# Patient Record
Sex: Male | Born: 1935 | Race: White | Hispanic: No | Smoking: Former smoker
Health system: Southern US, Community
[De-identification: ages and names within clinical notes are randomized; demographics above are authoritative.]

## PROBLEM LIST (undated history)

## (undated) DIAGNOSIS — R531 Weakness: Secondary | ICD-10-CM

## (undated) DIAGNOSIS — I8390 Asymptomatic varicose veins of unspecified lower extremity: Secondary | ICD-10-CM

## (undated) DIAGNOSIS — Z860100 Personal history of colon polyps, unspecified: Secondary | ICD-10-CM

## (undated) DIAGNOSIS — M542 Cervicalgia: Secondary | ICD-10-CM

## (undated) DIAGNOSIS — I10 Essential (primary) hypertension: Secondary | ICD-10-CM

## (undated) DIAGNOSIS — M1990 Unspecified osteoarthritis, unspecified site: Secondary | ICD-10-CM

## (undated) DIAGNOSIS — R350 Frequency of micturition: Secondary | ICD-10-CM

## (undated) DIAGNOSIS — F329 Major depressive disorder, single episode, unspecified: Secondary | ICD-10-CM

## (undated) DIAGNOSIS — C801 Malignant (primary) neoplasm, unspecified: Secondary | ICD-10-CM

## (undated) DIAGNOSIS — F32A Depression, unspecified: Secondary | ICD-10-CM

## (undated) DIAGNOSIS — H9190 Unspecified hearing loss, unspecified ear: Secondary | ICD-10-CM

## (undated) DIAGNOSIS — E785 Hyperlipidemia, unspecified: Secondary | ICD-10-CM

## (undated) DIAGNOSIS — Z8719 Personal history of other diseases of the digestive system: Secondary | ICD-10-CM

## (undated) DIAGNOSIS — Z87898 Personal history of other specified conditions: Secondary | ICD-10-CM

## (undated) DIAGNOSIS — Z87442 Personal history of urinary calculi: Secondary | ICD-10-CM

## (undated) DIAGNOSIS — G2581 Restless legs syndrome: Secondary | ICD-10-CM

## (undated) DIAGNOSIS — F419 Anxiety disorder, unspecified: Secondary | ICD-10-CM

## (undated) DIAGNOSIS — G8929 Other chronic pain: Secondary | ICD-10-CM

## (undated) HISTORY — DX: Essential (primary) hypertension: I10

## (undated) HISTORY — PX: HERNIA REPAIR: SHX51

## (undated) HISTORY — DX: Malignant (primary) neoplasm, unspecified: C80.1

## (undated) HISTORY — PX: OTHER SURGICAL HISTORY: SHX169

## (undated) HISTORY — PX: COLONOSCOPY: SHX174

## (undated) HISTORY — DX: Asymptomatic varicose veins of unspecified lower extremity: I83.90

## (undated) HISTORY — PX: INNER EAR SURGERY: SHX679

## (undated) HISTORY — PX: JOINT REPLACEMENT: SHX530

## (undated) HISTORY — PX: PROSTATE SURGERY: SHX751

## (undated) HISTORY — DX: Hyperlipidemia, unspecified: E78.5

## (undated) HISTORY — PX: EYE SURGERY: SHX253

## (undated) HISTORY — PX: BACK SURGERY: SHX140

---

## 1994-01-14 DIAGNOSIS — C801 Malignant (primary) neoplasm, unspecified: Secondary | ICD-10-CM

## 1994-01-14 HISTORY — DX: Malignant (primary) neoplasm, unspecified: C80.1

## 1998-10-11 ENCOUNTER — Encounter: Payer: Self-pay | Admitting: General Surgery

## 1998-10-13 ENCOUNTER — Ambulatory Visit (HOSPITAL_COMMUNITY): Admission: RE | Admit: 1998-10-13 | Discharge: 1998-10-14 | Payer: Self-pay | Admitting: General Surgery

## 2005-09-04 ENCOUNTER — Encounter: Admission: RE | Admit: 2005-09-04 | Discharge: 2005-09-04 | Payer: Self-pay | Admitting: Orthopaedic Surgery

## 2005-10-23 ENCOUNTER — Inpatient Hospital Stay (HOSPITAL_COMMUNITY): Admission: RE | Admit: 2005-10-23 | Discharge: 2005-10-27 | Payer: Self-pay | Admitting: Orthopaedic Surgery

## 2007-10-27 ENCOUNTER — Ambulatory Visit (HOSPITAL_COMMUNITY): Admission: RE | Admit: 2007-10-27 | Discharge: 2007-10-28 | Payer: Self-pay | Admitting: Urology

## 2009-10-30 ENCOUNTER — Inpatient Hospital Stay (HOSPITAL_COMMUNITY)
Admission: RE | Admit: 2009-10-30 | Discharge: 2009-11-02 | Payer: Self-pay | Source: Home / Self Care | Admitting: Orthopedic Surgery

## 2010-03-28 LAB — CBC
HCT: 26.8 % — ABNORMAL LOW (ref 39.0–52.0)
HCT: 28 % — ABNORMAL LOW (ref 39.0–52.0)
HCT: 30.5 % — ABNORMAL LOW (ref 39.0–52.0)
Hemoglobin: 10 g/dL — ABNORMAL LOW (ref 13.0–17.0)
Hemoglobin: 10.7 g/dL — ABNORMAL LOW (ref 13.0–17.0)
Hemoglobin: 9.5 g/dL — ABNORMAL LOW (ref 13.0–17.0)
MCHC: 35.1 g/dL (ref 30.0–36.0)
MCV: 95.8 fL (ref 78.0–100.0)
MCV: 96.1 fL (ref 78.0–100.0)
RBC: 2.79 MIL/uL — ABNORMAL LOW (ref 4.22–5.81)
RBC: 2.92 MIL/uL — ABNORMAL LOW (ref 4.22–5.81)
RBC: 3.18 MIL/uL — ABNORMAL LOW (ref 4.22–5.81)
WBC: 8.6 10*3/uL (ref 4.0–10.5)
WBC: 9 10*3/uL (ref 4.0–10.5)

## 2010-03-28 LAB — BASIC METABOLIC PANEL
BUN: 11 mg/dL (ref 6–23)
CO2: 30 mEq/L (ref 19–32)
Chloride: 102 mEq/L (ref 96–112)
GFR calc Af Amer: >60 mL/min (ref >60)
GFR calc non Af Amer: >60 mL/min (ref >60)
GFR calc non Af Amer: >60 mL/min (ref >60)
Glucose, Bld: 149 mg/dL — ABNORMAL HIGH (ref 70–99)
Potassium: 4 mEq/L (ref 3.5–5.1)
Potassium: 4.3 mEq/L (ref 3.5–5.1)
Sodium: 138 mEq/L (ref 135–145)
Sodium: 139 mEq/L (ref 135–145)

## 2010-03-28 LAB — PROTIME-INR
INR: 1.13 (ref 0.00–1.49)
INR: 1.51 — ABNORMAL HIGH (ref 0.00–1.49)
INR: 2.17 — ABNORMAL HIGH (ref 0.00–1.49)

## 2010-03-28 LAB — TYPE AND SCREEN: Antibody Screen: NEGATIVE

## 2010-03-29 LAB — COMPREHENSIVE METABOLIC PANEL
ALT: 24 U/L (ref 0–53)
Albumin: 4.4 g/dL (ref 3.5–5.2)
Alkaline Phosphatase: 43 U/L (ref 39–117)
BUN: 17 mg/dL (ref 6–23)
Chloride: 104 mEq/L (ref 96–112)
Glucose, Bld: 114 mg/dL — ABNORMAL HIGH (ref 70–99)
Potassium: 4.3 mEq/L (ref 3.5–5.1)
Sodium: 139 mEq/L (ref 135–145)
Total Bilirubin: 1.7 mg/dL — ABNORMAL HIGH (ref 0.3–1.2)
Total Protein: 7.8 g/dL (ref 6.0–8.3)

## 2010-03-29 LAB — URINALYSIS, ROUTINE W REFLEX MICROSCOPIC
Glucose, UA: NEGATIVE mg/dL
Ketones, ur: NEGATIVE mg/dL
Nitrite: NEGATIVE
Specific Gravity, Urine: 1.012 (ref 1.005–1.030)
pH: 7.5 (ref 5.0–8.0)

## 2010-03-29 LAB — CBC
HCT: 44.4 % (ref 39.0–52.0)
MCV: 96.6 fL (ref 78.0–100.0)
RBC: 4.6 MIL/uL (ref 4.22–5.81)
WBC: 8.9 10*3/uL (ref 4.0–10.5)

## 2010-03-29 LAB — PROTIME-INR
INR: 0.94 (ref 0.00–1.49)
Prothrombin Time: 12.8 seconds (ref 11.6–15.2)

## 2010-03-29 LAB — SURGICAL PCR SCREEN: MRSA, PCR: NEGATIVE

## 2010-05-29 NOTE — Op Note (Signed)
NAME:  [name], [name]              ACCOUNT NO.:  [id number]   MEDICAL RECORD NO.:  [id number]          PATIENT TYPE:  AMB   LOCATION:  DAY                          FACILITY:  [facility]   PHYSICIAN:  Courtney Paris, M.D.DATE OF BIRTH:  1933-08-08   DATE OF PROCEDURE:  10/27/2007  DATE OF DISCHARGE:                               OPERATIVE REPORT   PREOPERATIVE DIAGNOSIS:  Malfunctioning inflatable penile prosthesis.   POSTOPERATIVE DIAGNOSIS:  Malfunctioning inflatable penile prosthesis.   PROCEDURE:  Replace Ultrex AMS inflatable penile prosthesis with  AMS  700 CX pre-connect.   ANESTHESIA:  General.   SURGEON:  Courtney Paris, M.D.   ASSISTANTLorin Picket A. MacDiarmid, MD.   BRIEF HISTORY:  This 77 year old patient had a radical prostatectomy for  prostate cancer in December, 1996 that has not recurred.  He had a  inflatable penile prosthesis inserted in 1998 that worked well up until  August of this year.  It just stopped functioning, probably with a leak.  He enters to have this replaced at this time.  Other operations include  two rods were placed in his back in October, 2007.  They were originally  placed in 1973.   The patient was placed on the operating table in the dorsal lithotomy  position.  After satisfactory induction of general anesthesia he was  prepped and draped with Betadine in the usual sterile fashion.  He had 5-  minute timed scrub.  Time-out was then performed and the patient and the  procedure were then reconfirmed.  He had preop antibiotics given  appropriately.  His original prosthesis had been placed infrapubically.  So it decided to go ahead and do it again this way since all of the  tubing was up in that location.  An incision was made with the knife  blade and then carried down with the Bovie cutting current down to  expose the tubing.  The tubing was traced down to each corpora which was  then opened and the distal end of the prosthesis came  easily, but the  proximal ends had to be dissected out.  The covering of the prosthesis  tended to fray somewhat, but we did get the entire prosthesis with the  rear tips out on both sides.  There was very little bleeding within the  pseudocapsule.  Next, the pump in the left upper scrotum was dissected  free of the pseudocapsule and the tubing going to the reservoir in his  left lower quadrant which had been placed with a separate incision was  traced back as far as it would go and then cut.  It was decided to go  ahead leave that in situ and then place the new reservoir on the  contralateral right side rather then try to dissect out the old  reservoir.  The incision was lengthened slightly and with traction the  cord structures were identified and then the external oblique fascia was  identified and a small incision made in this and the muscle was  dissected through and the preperitoneal space was then opened.  This was  developed  manually with fingers.  The reservoir which would hold 100 mL  was then placed into this space and then inflated.  It was slightly  hydrodistended with about 110 mL of fluid and then with 95 mL of fluid  left in it.  There was absolutely no back pressure.  This was left  clamped off and the space closed carefully with 2-0 Vicryl suture.  Next, the corpora was then sized.  It was 10 cm proximally and 12 cm  distally on either side.  Using the pre-connected prosthesis originally  for the penile scrotal area seemed to fit well with 15 cm of prosthesis  with 7 cm of rear tip extenders.  These were placed on so that they  stacked nicely.  The sutures were then placed with 2-0 Vicryl through  the corpora cavernosa on either side prior to placing of the of  prosthesis which was then filled on the back table in a sterile fashion.  When this was done on both sides the prosthesis was first placed on the  right side by placing the Hardy needle through the end of the  prosthesis  and poking this out through the glans and pulling up the prosthesis  distally and placed proximally without any tension.  When this was done  on the right side the sutures that had been preplaced with Vicryl were  then tied over the prosthesis leaving a nice closure on the right side.  Similarly on the left side the [place] needle was then used to thread the  suture through the end of the prosthesis which was then through the  introducer placed out through the end of the glans penis and then pulled  distally and again the pre-tied Vicryl sutures were then tied over the  prosthesis on the left.  The system was then tested and it seemed to  function properly and irrigated with orthopedic bug juice.  The pump was  then placed in the left upper scrotum and the pseudocapsule.  It was  already a little high to begin with, but it fit nicely in the same  position that it was before.  I then closed the little bit of tissue  over the tubing so that it was not exposed and also closed the tubing  over the tube going to the reservoir as well.  The wound was again  irrigated with orthopedic bug juice.  The 3-0 chromic catgut was then  used to close the subcutaneous over in the infrapubic incision and skin  clips used for the skin.  Sterile dry dressing was applied.  The  patient's penis was left in a partially inflated position with a Foley  catheter in place.  Sponge, instrument and needle counts were correct.  The patient returned  to the recovery room in good condition to be later admitted for  overnight care.      Courtney Paris, M.D.  Electronically Signed     HMK/MEDQ  D:  10/27/2007  T:  10/27/2007  Job:  [id number]

## 2010-05-29 NOTE — Discharge Summary (Signed)
NAMESHERIDAN, [name]              ACCOUNT NO.:  [id number]   MEDICAL RECORD NO.:  [id number]          PATIENT TYPE:  OIB   LOCATION:  1414                         FACILITY:  [facility]   PHYSICIAN:  Courtney Paris, M.D.DATE OF BIRTH:  March 21, 1933   DATE OF ADMISSION:  10/27/2007  DATE OF DISCHARGE:  10/28/2007                               DISCHARGE SUMMARY   DISCHARGE DIAGNOSES:  1. Malfunctioning inflatable penile prosthesis.  2. Previous prostate cancer.   OPERATION/PROCEDURES:  Replace Altrex AMS inflatable penile prosthesis  with AMS 700-CX pre-connect IPP on October 27, 2007.   BRIEF HISTORY:  This 77 year old patient had a radical prostatectomy for  prostate cancer in December of 1996 that has not recurred.  He had a  penile inflatable prosthesis inserted in 1998 that worked well up until  August of this year.  It just stopped functioning, probably with a leak.  He wants to have this replaced at this time.  He has also had 2 rods in  his back replaced in October 2007.  The original operation was in 1973.   HOSPITAL COURSE:  After satisfactory preop evaluation, he was taken to  the operating room where he underwent removal of the old prosthesis,  which had leaked, and a new AMS 600-CX was inserted without difficulty.  He had a Foley catheter overnight.  This was removed in the morning and  he was able to void without difficulty.  He had very little pain and  just took some hydrocodone to help him sleep the night before discharge.  He did have a little bit of swelling of his scrotum and his penis was  left in a partially inflated position, and will come back in a week for  removal of his skin staples.  Detailed written instructions were given.  He was sent home on his previous medications including Cipro and  hydrocodone for pain.  He was sent home in improved, ambulatory  condition on a regular diet.      Courtney Paris, M.D.  Electronically Signed     HMK/MEDQ  D:  10/28/2007  T:  10/28/2007  Job:  [id number]

## 2010-06-01 NOTE — Discharge Summary (Signed)
NAMEOLUWATOSIN, HIGGINSON              ACCOUNT NO.:  [id number]   MEDICAL RECORD NO.:  [id number]          PATIENT TYPE:  INP   LOCATION:  5013                         FACILITY:  [facility]   PHYSICIAN:  Verlin Fester, P.A.    DATE OF BIRTH:  08-May-1933   DATE OF ADMISSION:  10/23/2005  DATE OF DISCHARGE:  10/27/2005                               DISCHARGE SUMMARY   ADMITTING DIAGNOSES:  1. Spinal stenosis and spondylosis L1 to L4.  2. Hypertension.  3. History of prostate cancer.  4. Hypercholesterolemia.  5. Hypertension.   DISCHARGE DIAGNOSES:  1. Status post L1 5o L4 revision laminectomy and fusion.  2. Postoperative blood loss anemia.  3. Postoperative hypokalemia.  4. Spinal stenosis and spondylosis L1 to L4.  5. Hypertension.  6. History of prostate cancer.  7. Hypercholesterolemia.  8. Hypertension.   PROCEDURE:  On October 23, 2005, the patient was taken to the operating  room for L1 to L4 revision laminectomy and posterior spinal fusion with  pedicle screws, L3-4 TLIF, BMP and Grafton.  Surgeon was Dr. Sharolyn Douglas,  assistant [facility], P.A.-C.  Anesthesia was general.   CONSULTS:  None.   LABS:  Preoperatively CBC with differential showed monos of 12, absolute  monos 0.9, otherwise normal.  H&H reached a low of 10.3 and 29.1 on  October 16, 2005.  PT/INR and PTT preop normal.  Complete metabolic  panel preop was normal.  Basic metabolic panel was monitored x3 days  postoperatively.  Postoperative day 1 glucose was 141, otherwise normal.  Postoperative day 2 glucose was 153, potassium 3.3, otherwise normal.  Postoperative day 3 glucose was 114, otherwise normal.  UA preop was  negative with the exception of WBCs of 3-6 and few bacteria.  Blood  typing was O+, antibody screen was negative.  Urine culture from October 17, 2005, showed Enterobacter species.  X-rays were used on October 23, 2005, intraoperatively for localization and also postoperatively to  confirm  fusion from L1 to L4.  No EKG is seen on the chart.   BRIEF HISTORY:  The patient is a 77 year old male who has had a long  history of problems related to his back.  He has previously undergone a  lumbar fusion L4-S1.  He did relatively well with that fusion until the  last several years.  Unfortunately he has been having increasing  problems with his back.  It is effecting his activities of daily living  and quality of life.  He is found to have spinal stenosis and scoliosis  with spondylosis from L1 down to his previous fusion.  It was thought  the best course of management would be decompression and posterior  spinal fusion.  The risks and benefits of this procedure were discussed  with the patient by Dr. Noel Gerold, as well as myself.  He indicated  understanding and opted to proceed.   HOSPITAL COURSE:  The patient is a 77 year old male who was admitted to  the hospital on October 23, 2005, and taken to the operating room for  the above listed procedure.  He tolerated the  procedure well without any  intraoperative complications.  He was transferred to the recovery room  in stable condition.   Routine orthopedic spine protocol was followed through his postoperative  course.  He progressed along well.  He did not develop any significant  medical complications throughout his postoperative course.   Physical therapy and occupational therapy worked with the patient on a  daily basis.  He worked on a progressive ambulation program, back  precautions, as well as brace use.  He was independent and safe with all  of these prior to discharge.   By October 27, 2005, the patient had met all orthopedic goals.  He was  medically stable and ready for discharge home.   DISCHARGE PLAN:  The patient is a 77 year old male status post revision  posterior spinal fusion and laminectomy, doing well.   ACTIVITY:  Daily ambulation program.  Brace on when he is up.  Back  precautions at all times.  Avoid  lifting heavier than 5 pounds.  He may  shower.   MEDICATIONS:  1. Vicodin for pain.  2. Robaxin for muscle spasms.  3. Multivitamin daily.  4. Calcium daily.  5. Colace twice daily.  6. Laxative as needed.  7. Avoid NSAIDs.   DIET:  Regular home diet as tolerated.   FOLLOWUP:  Two weeks postoperatively with Dr. Noel Gerold.   CONDITION ON DISCHARGE:  Stable and improved.   DISPOSITION:  The patient is being discharged to his home with his  family assistance, as well as home health physical therapy and  occupational therapy.      Verlin Fester, P.A.     CM/MEDQ  D:  02/20/2006  T:  02/20/2006  Job:  [id number]

## 2010-06-01 NOTE — Op Note (Signed)
[name], [name] NO.:  Archer   MEDICAL RECORD NO.:  [id number]          PATIENT TYPE:  INP   LOCATION:  5013                         FACILITY:  [facility]   PHYSICIAN:  Sharolyn Douglas, M.D.        DATE OF BIRTH:  14-Apr-1933   DATE OF PROCEDURE:  10/23/2005  DATE OF DISCHARGE:                                 OPERATIVE REPORT   DIAGNOSES:  1. Severe lumbar spinal stenosis.  2. Lumbar disk herniations L2-3, L3-4 and L4-5.  3. Post laminectomy syndrome status post previous L4 through S1 in situ      fusion and laminectomy.  4. Lumbar scoliosis.   PROCEDURE:  1. Exploration of L4 through S1 fusion.  2. Revision lumbar laminectomy from L1 down to L5 with wide decompression      of the thecal sac and nerve roots at each level.  3. Posterior spinal arthrodesis L1 through L4.  4. Transforaminal lumbar interbody fusion L3-4 on the right side with      placement of 12-mm PEEK cage.  5. Segmental pedicle screw instrumentation L1 through L4 using the Abbott      spine system.  6. Local autogenous bone graft supplemented with 15 mL Grafton allograft      and bone morphogenic protein.   SURGEON:  Sharolyn Douglas, M.D.   ASSISTANT:  Verlin Fester, P.A.   ANESTHESIA:  General endotracheal anesthesia.   COMPLICATIONS:  None.   NEEDLE AND SPONGE COUNT:  Correct.   INDICATIONS:  The patient is a pleasant 77 year old male with chronic  progressive back and bilateral lower extremity pain right greater than left.  He has had a previous in situ fusion from L4 to S1 with laminectomy.  His  imaging studies show degenerative scoliosis above the fusion with severe  spinal stenosis secondary to spondylosis and large disk herniation.  He now  presents for revision decompression and fusion extending from L1 down to the  previous fusion mass at L4.  Risks, benefits, alternatives were reviewed and  the patient elected to proceed.   PROCEDURE:  After informed consent, the patient was  taken to the operating  room.  He underwent general endotracheal anesthesia without difficulty.  He  was given prophylactic IV antibiotics.  Neuro monitoring was established in  the form of SSEPs and lower extremity EMGs.  He was carefully turned prone  onto the AcroMed positioning frame.  The pads were placed under his chest  and pelvis.  All bony prominences were padded.  Face and eyes protected at  all times.  Back was prepped and draped in the usual sterile fashion.  The  previous incision was utilized and extended several inches proximally.  Dissection was carried through the previous scar and the residual spinous  processes down to L3 were identified.  Below that level the processes had  been removed.  Carefully dissecting through the scar a wide subperiosteal  exposure was completed, exposing the transverse processes of L1, L2, L3 and  L4 bilaterally.  We then exposed the residual fusion mass from L4 down to  the sacrum with  the electrocautery.  This was examined and we could not find  any evidence of a pseudoarthrosis.  In addition by pushing on the fusion  mass with a Cobb, the levels appeared to move as a unit.  We felt that the  fusion was solid from L4 to S1.  We then turned our attention to completing  a revision laminectomy.   There was a tremendous amount of scarring as well as bony overgrowth of the  posterior elements.  This was all debrided with a Leksell rongeur.  We  removed the spinous processes of L1, L2 and L3.  The high-speed bur was then  used to further thin the lamina which was very thickened and also take down  the medial portion of the facet joint complexes.  We then used to a curette  with loupes and headlight magnification to carefully dissect the edges of  the previous laminectomy.  Using great care a laminectomy was then carried  out all the way out to L1.  The ligamentum flavum was thickened throughout  and there was severe spinal stenosis at each level.   The worst was at L3-4.  At this level there was a large disk rupture on the right side that was  displacing the thecal sac towards the right and completely occluding the  spinal canal.  The L3 nerve root was being pinned against the L3 pedicle by  the disk herniation.  In addition, the L4 nerve root was severely compressed  in the lateral recess.  Once we had decompressed the cephalad roots  including L1, L2 and L3 we turned our attention to placing pedicle screws  and completing the posterior spinal fusion.  Each pedicle starting point was  identified from L1 down to L4 using anatomic landmarks and also direct  palpation from within the spinal canal.  The pedicle holes were initiated  with the awl.  We then used the pedicle probe to cannulate the pedicles.  The ball-tip feeler was used to confirm there were no breeches.  Each  pedicle was tapped with a 6.0 tap.  We then placed 6.5 x 50 mm screws at  each level.  The screw purchase was adequate.  We stimulated the screws with  triggered EMGs and there were no deleterious changes.  Before placing each  screw the transverse processes were decorticated using the high-speed bur to  complete the posterior spinal fusion.  Local bone graft which had been  obtained from the laminectomy, cleaned and morselized was mixed with 15 mL  of Grafton allograft and packed over the transverse processes from L1 down  to L4.  At this point we proceeded with a transforaminal lumbar interbody  fusion on the right side at L3-4 to further decompress the foramen by  indirect distraction, improve the fusion rate and also bring about a partial  reduction of the scoliosis due to collapse at this level.  The disk space  was entered.  We were then able to decompress the disk herniation back into  the interspace, and this was removed with pituitary rongeurs.  A radical diskectomy was completed and the cartilaginous endplates were scraped across  to the contralateral side.   Intervertebral spreaders were used and we were  able to mobilize the disk space up to 12 mm.  We then packed the interspace  with BMP sponges along with local bone graft.  A 12-mm PEEK cage was then  inserted into the interspace and tamped anteriorly.  We purposely kept  the  cage on the right side in order to further reduce the scoliosis.  Free  running EMGs were monitored throughout the TLIF procedure and there were no  deleterious changes.  We took an intraoperative x-ray which showed the  instrumentation at the appropriate level and with acceptable positioning.  Hemostasis was achieved.  The wound was irrigated.  Titanium rods were  placed into the polyaxial screw heads.  The locking caps were placed and  sheared off.  Two cross connectors were applied.  A deep Hemovac drain was  left in place.  The deep fascia was closed with a running #1 Vicryl suture,  subcutaneous layer closed with 2-0 Vicryl followed by a running 3-0 nylon  suture to approximate the skin edges.  The patient was turned supine,  extubated without difficulty and transferred to recovery in stable  condition, able to move his upper and lower extremities.   It should be noted that my assistant, Verlin Fester, P.A., was present  throughout the procedure including during the positioning, the exposure, the  decompression, the instrumentation, the fusion, and she also assisted with  wound closure.      Sharolyn Douglas, M.D.  Electronically Signed    MC/MEDQ  D:  10/23/2005  T:  10/25/2005  Job:  [id number]

## 2010-10-16 LAB — BASIC METABOLIC PANEL
Calcium: 10
GFR calc Af Amer: >60
GFR calc non Af Amer: >60
Potassium: 3.5
Sodium: 138

## 2010-10-16 LAB — CBC
HCT: 44
Hemoglobin: 15.3
RBC: 4.5
RDW: 12.8
WBC: 8.1

## 2010-10-16 LAB — URINALYSIS, ROUTINE W REFLEX MICROSCOPIC
Nitrite: NEGATIVE
Specific Gravity, Urine: 1.014
Urobilinogen, UA: 0.2
pH: 6

## 2014-08-22 ENCOUNTER — Other Ambulatory Visit: Payer: Self-pay | Admitting: *Deleted

## 2014-08-22 DIAGNOSIS — I83891 Varicose veins of right lower extremities with other complications: Secondary | ICD-10-CM

## 2014-11-10 ENCOUNTER — Encounter: Payer: Self-pay | Admitting: Vascular Surgery

## 2014-11-14 ENCOUNTER — Encounter: Payer: Self-pay | Admitting: Vascular Surgery

## 2014-11-14 ENCOUNTER — Ambulatory Visit (HOSPITAL_COMMUNITY)
Admission: RE | Admit: 2014-11-14 | Discharge: 2014-11-14 | Disposition: A | Discharge status: Home / Self Care | Payer: Medicare Other | Source: Ambulatory Visit | Attending: Vascular Surgery | Admitting: Vascular Surgery

## 2014-11-14 ENCOUNTER — Ambulatory Visit (INDEPENDENT_AMBULATORY_CARE_PROVIDER_SITE_OTHER): Payer: Medicare Other | Admitting: Vascular Surgery

## 2014-11-14 VITALS — BP 120/74 | HR 75 | Resp 18 | Ht 72.0 in | Wt 185.0 lb

## 2014-11-14 DIAGNOSIS — I83891 Varicose veins of right lower extremities with other complications: Secondary | ICD-10-CM

## 2014-11-14 DIAGNOSIS — E785 Hyperlipidemia, unspecified: Secondary | ICD-10-CM | POA: Diagnosis not present

## 2014-11-14 DIAGNOSIS — I10 Essential (primary) hypertension: Secondary | ICD-10-CM | POA: Insufficient documentation

## 2014-11-14 DIAGNOSIS — I83899 Varicose veins of unspecified lower extremities with other complications: Secondary | ICD-10-CM | POA: Insufficient documentation

## 2014-11-14 NOTE — Progress Notes (Signed)
Subjective:     Patient ID: [name], male   DOB: 04-12-1933, 81 y.o.   MRN: [id number]  HPI this 81 year old male is referred for painful varicosities in the right leg. He has aching throbbing and burning discomfort in the right leg and numbness in the right foot which worsens as the day progresses. He has no history of DVT thrombophlebitis stasis ulcers or bleeding. He does have a history of 2 back operations which cause some discomfort in both legs. He states that the bulging varicosities are getting larger and he is concerned about this aspect of it.  Past Medical History  Diagnosis Date  . Hypertension   . Varicose veins   . Hyperlipidemia   . Cancer [facility])     prostate    Social History  Substance Use Topics  . Smoking status: Former Smoker    Quit date: 11/14/1978  . Smokeless tobacco: Current User    Types: Chew  . Alcohol Use: No    No family history on file.  Allergies  Allergen Reactions  . Penicillins Rash     Current outpatient prescriptions:  .  ALPRAZolam (XANAX) 0.25 MG tablet, Take 0.25 mg by mouth at bedtime as needed for anxiety., Disp: , Rfl:  .  amLODipine (NORVASC) 5 MG tablet, Take 5 mg by mouth daily., Disp: , Rfl:  .  celecoxib (CELEBREX) 200 MG capsule, Take 200 mg by mouth daily., Disp: , Rfl:  .  Cholecalciferol (VITAMIN D-3 PO), Take by mouth daily., Disp: , Rfl:  .  fenofibrate micronized (LOFIBRA) 200 MG capsule, Take 200 mg by mouth daily before breakfast., Disp: , Rfl:  .  Fish Oil-Cholecalciferol (FISH OIL + D3) 1000-1000 MG-UNIT CAPS, Take by mouth daily., Disp: , Rfl:  .  labetalol (NORMODYNE) 200 MG tablet, Take 200 mg by mouth 2 (two) times daily., Disp: , Rfl:  .  MULTIPLE VITAMINS PO, Take by mouth daily., Disp: , Rfl:  .  olmesartan-hydrochlorothiazide (BENICAR HCT) 40-25 MG tablet, Take 1 tablet by mouth daily., Disp: , Rfl:  .  pravastatin (PRAVACHOL) 40 MG tablet, Take 40 mg by mouth daily., Disp: , Rfl:   Filed Vitals:   11/14/14 1012  BP: 120/74  Pulse: 75  Resp: 18  Height: 6' (1.829 m)  Weight: 185 lb (83.915 kg)  SpO2: 97%    Body mass index is 25.08 kg/(m^2).           Review of Systems denies chest pain, dyspnea on exertion, PND, orthopnea, hemoptysis, is able to ambulate about 1 block. Has history of prostate cancer.     Objective:   Physical Exam BP 120/74 mmHg  Pulse 75  Resp 18  Ht 6' (1.829 m)  Wt 185 lb (83.915 kg)  BMI 25.08 kg/m2  SpO2 97%  Gen.-alert and oriented x3 in no apparent distress HEENT normal for age Lungs no rhonchi or wheezing Cardiovascular regular rhythm no murmurs carotid pulses 3+ palpable no bruits audible Abdomen soft nontender no palpable masses Musculoskeletal free of  major deformities Skin clear -no rashes Neurologic normal Lower extremities 3+ femoral and dorsalis pedis pulses palpable bilaterally with no edema Right leg has bulging varicosities in the posterior thigh down to the popliteal space and also in the medial distal thigh and medial calf. Early hyperpigmentation lower third right leg. 3+ dorsalis pedis pulse palpable.  Today I ordered a venous duplex exam of the right leg which I reviewed and interpreted. There is no DVT. There is gross  reflux in the right great saphenous vein extending down to the distal thigh supplying these painful varicosities.       Assessment:     Painful varicosities right leg due to gross reflux right great saphenous vein. Symptoms are affecting patient's daily living.    Plan:         #1 long leg elastic compression stockings 20-30 mm gradient #2 elevate legs as much as possible #3 ibuprofen daily on a regular basis for pain #4 return in 3 months-if no significant improvement then he will require number-one laser ablation right great saphenous vein followed by three-month waiting. And then be evaluated for stab phlebectomy of painful varicosities. Patient to return in 3 months

## 2014-11-15 ENCOUNTER — Encounter: Payer: Medicare Other | Admitting: Vascular Surgery

## 2014-11-15 ENCOUNTER — Encounter (HOSPITAL_COMMUNITY): Payer: Medicare Other

## 2015-01-15 HISTORY — PX: CARPAL TUNNEL RELEASE: SHX101

## 2015-02-03 ENCOUNTER — Encounter: Payer: Self-pay | Admitting: Vascular Surgery

## 2015-02-06 ENCOUNTER — Encounter: Payer: Self-pay | Admitting: Vascular Surgery

## 2015-02-14 ENCOUNTER — Encounter: Payer: Self-pay | Admitting: Vascular Surgery

## 2015-02-14 ENCOUNTER — Ambulatory Visit (INDEPENDENT_AMBULATORY_CARE_PROVIDER_SITE_OTHER): Payer: Medicare Other | Admitting: Vascular Surgery

## 2015-02-14 ENCOUNTER — Other Ambulatory Visit: Payer: Self-pay | Admitting: *Deleted

## 2015-02-14 VITALS — BP 103/61 | HR 81 | Temp 98.0°F | Resp 16 | Ht 72.0 in | Wt 185.0 lb

## 2015-02-14 DIAGNOSIS — I83891 Varicose veins of right lower extremities with other complications: Secondary | ICD-10-CM | POA: Insufficient documentation

## 2015-02-14 DIAGNOSIS — I83811 Varicose veins of right lower extremities with pain: Secondary | ICD-10-CM

## 2015-02-14 NOTE — Progress Notes (Addendum)
Subjective:     Patient ID: [name], male   DOB: 09-Aug-1933, 82 y.o.   MRN: [id number]  HPI this 82 year old male returns for continued follow-up regarding his painful varicosities in the right leg. This gentleman has had worsening varicose veins over the past few years and now develops a heavy aching feeling as the day progresses. He has tried long-leg elastic compression stockings 20-30 millimeter gradient as well as elevation and ibuprofen with no improvement in symptoms. He has no symptoms in the contralateral left leg. He does have chronic numbness in the right foot due to 2 previous back operations. Symptoms are affecting his daily living.  Past Medical History  Diagnosis Date  . Hypertension   . Varicose veins   . Hyperlipidemia   . Cancer [facility])     prostate    Social History  Substance Use Topics  . Smoking status: Former Smoker    Quit date: 11/14/1978  . Smokeless tobacco: Current User    Types: Chew  . Alcohol Use: No    History reviewed. No pertinent family history.  Allergies  Allergen Reactions  . Penicillins Rash     Current outpatient prescriptions:  .  ALPRAZolam (XANAX) 0.25 MG tablet, Take 0.25 mg by mouth at bedtime as needed for anxiety., Disp: , Rfl:  .  amLODipine (NORVASC) 5 MG tablet, Take 5 mg by mouth daily., Disp: , Rfl:  .  celecoxib (CELEBREX) 200 MG capsule, Take 200 mg by mouth daily., Disp: , Rfl:  .  Cholecalciferol (VITAMIN D-3 PO), Take by mouth daily., Disp: , Rfl:  .  fenofibrate micronized (LOFIBRA) 200 MG capsule, Take 200 mg by mouth daily before breakfast., Disp: , Rfl:  .  Fish Oil-Cholecalciferol (FISH OIL + D3) 1000-1000 MG-UNIT CAPS, Take by mouth daily., Disp: , Rfl:  .  labetalol (NORMODYNE) 200 MG tablet, Take 200 mg by mouth 2 (two) times daily., Disp: , Rfl:  .  MULTIPLE VITAMINS PO, Take by mouth daily., Disp: , Rfl:  .  olmesartan-hydrochlorothiazide (BENICAR HCT) 40-25 MG tablet, Take 1 tablet by mouth daily., Disp:  , Rfl:  .  oxyCODONE-acetaminophen (PERCOCET/ROXICET) 5-325 MG tablet, Take 1 tablet by mouth daily as needed., Disp: , Rfl: 0 .  pravastatin (PRAVACHOL) 40 MG tablet, Take 40 mg by mouth daily., Disp: , Rfl:   Filed Vitals:   02/14/15 1102  BP: 103/61  Pulse: 81  Temp: 98 F (36.7 C)  Resp: 16  Height: 6' (1.829 m)  Weight: 185 lb (83.915 kg)  SpO2: 97%    Body mass index is 25.08 kg/(m^2).           Review of Systems denies chest pain, dyspnea on exertion, PND, orthopnea, hemoptysis, claudication.    Objective:   Physical Exam BP 103/61 mmHg  Pulse 81  Temp(Src) 98 F (36.7 C)  Resp 16  Ht 6' (1.829 m)  Wt 185 lb (83.915 kg)  BMI 25.08 kg/m2  SpO2 97%  Gen. l well-developed well-nourished male in no apparent distress alert and oriented 3 Lungs no rhonchi or wheezing  Right leg with extensive large bulging varicosities in the medial calf and posterior medial and lateral thigh extending down to the popliteal fossa communicating with the great saphenous vein in the distal thigh.  Formal venous duplex exam in October revealed gross reflux and a large caliber right great saphenous vein supplying these painful varicosities. Today I performed an independent sinus site bedside exam and the patient does have gross  reflux throughout a large right great saphenous vein supplying these painful varicosities     Assessment:     Painful varicosities right leg due to gross reflux right great saphenous vein with symptoms which are resistant to conservative measures including long-leg elastic compression stockings 20-30 mm gradient, elevation, and ibuprofen. Symptoms are affecting patient's daily living and require that he elevate his legs when symptoms occur    Plan:     Patient needs laser ablation right great saphenous vein followed by three-month waiting period and then reevaluation for possible stab phlebectomy of painful secondary varicosities We will proceed with  precertification to hopefully perform this and relieve his symptoms in the near future

## 2015-02-16 ENCOUNTER — Encounter: Payer: Self-pay | Admitting: Physician Assistant

## 2015-02-20 ENCOUNTER — Ambulatory Visit (INDEPENDENT_AMBULATORY_CARE_PROVIDER_SITE_OTHER): Payer: Medicare Other | Admitting: Vascular Surgery

## 2015-02-20 ENCOUNTER — Encounter: Payer: Self-pay | Admitting: Vascular Surgery

## 2015-02-20 VITALS — BP 127/86 | HR 80 | Temp 97.2°F | Resp 16 | Ht 72.0 in | Wt 182.0 lb

## 2015-02-20 DIAGNOSIS — I83891 Varicose veins of right lower extremities with other complications: Secondary | ICD-10-CM

## 2015-02-20 NOTE — Progress Notes (Signed)
Subjective:     Patient ID: [name], male   DOB: April 03, 1933, 82 y.o.   MRN: [id number]  HPI  This 82 year old male had laser ablation of the right great saphenous vein from the distal thigh to near the saphenofemoral junction performed under local tumescent anesthesia. A total of 1960 J of energy was utilized. He tolerated the procedure well.  Review of Systems     Objective:   Physical Exam BP 127/86 mmHg  Pulse 80  Temp(Src) 97.2 F (36.2 C)  Resp 16  Ht 6' (1.829 m)  Wt 182 lb (82.555 kg)  BMI 24.68 kg/m2  SpO2 97%       Assessment:      well-tolerated laser ablation right great saphenous vein performed under local tumescent anesthesia     Plan:      return in 1 week for venous duplex exam to confirm closure right great saphenous vein Patient will then return in 3 months for reevaluation to see if stab phlebectomy of painful varicosities will be indicated

## 2015-02-20 NOTE — Progress Notes (Signed)
Laser Ablation Procedure    Date: 02/20/2015   [name] DOB:1933-09-18  Consent signed: Yes    Surgeon:  Dr. Nelda Severe. Kellie Simmering  Procedure: Laser Ablation: right Greater Saphenous Vein  BP 127/86 mmHg  Pulse 80  Temp(Src) 97.2 F (36.2 C)  Resp 16  Ht 6' (1.829 m)  Wt 182 lb (82.555 kg)  BMI 24.68 kg/m2  SpO2 97%  Tumescent Anesthesia: 350 cc 0.9% NaCl with 50 cc Lidocaine HCL with 1% Epi and 15 cc 8.4% NaHCO3  Local Anesthesia: 4 cc Lidocaine HCL and NaHCO3 (ratio 2:1)  Pulsed Mode: 15 watts, 552m delay, 1.0 duration  Total Energy:2080              Total Pulses:139                Total Time: 2:19    Patient tolerated procedure well  Notes:   Description of Procedure:  After marking the course of the secondary varicosities, the patient was placed on the operating table in the supine position, and the right leg was prepped and draped in sterile fashion.   Local anesthetic was administered and under ultrasound guidance the saphenous vein was accessed with a micro needle and guide wire; then the mirco puncture sheath was placed.  A guide wire was inserted saphenofemoral junction , followed by a 5 french sheath.  The position of the sheath and then the laser fiber below the junction was confirmed using the ultrasound.  Tumescent anesthesia was administered along the course of the saphenous vein using ultrasound guidance. The patient was placed in Trendelenburg position and protective laser glasses were placed on patient and staff, and the laser was fired at 15 watts continuous mode advancing 1-248msecond for a total of 2080 joules.     Steri strips were applied to the stab wounds and ABD pads and thigh high compression stockings were applied.  Ace wrap bandages were applied over the phlebectomy sites and at the top of the saphenofemoral junction. Blood loss was less than 15 cc.  The patient ambulated out of the operating room having tolerated the procedure well.

## 2015-02-21 ENCOUNTER — Encounter: Payer: Self-pay | Admitting: Vascular Surgery

## 2015-02-21 ENCOUNTER — Telehealth: Payer: Self-pay | Admitting: *Deleted

## 2015-02-21 NOTE — Telephone Encounter (Signed)
Pt doing well. No pain or problems. Following all instructions.

## 2015-02-27 ENCOUNTER — Ambulatory Visit (INDEPENDENT_AMBULATORY_CARE_PROVIDER_SITE_OTHER): Payer: Medicare Other | Admitting: Vascular Surgery

## 2015-02-27 ENCOUNTER — Ambulatory Visit (HOSPITAL_COMMUNITY)
Admission: RE | Admit: 2015-02-27 | Discharge: 2015-02-27 | Disposition: A | Discharge status: Home / Self Care | Payer: Medicare Other | Source: Ambulatory Visit | Attending: Vascular Surgery | Admitting: Vascular Surgery

## 2015-02-27 ENCOUNTER — Other Ambulatory Visit: Payer: Medicare Other | Admitting: Vascular Surgery

## 2015-02-27 ENCOUNTER — Encounter: Payer: Self-pay | Admitting: Vascular Surgery

## 2015-02-27 VITALS — BP 111/70 | HR 78 | Temp 97.8°F | Resp 16 | Ht 72.0 in | Wt 182.0 lb

## 2015-02-27 DIAGNOSIS — E785 Hyperlipidemia, unspecified: Secondary | ICD-10-CM | POA: Diagnosis not present

## 2015-02-27 DIAGNOSIS — I83891 Varicose veins of right lower extremities with other complications: Secondary | ICD-10-CM | POA: Diagnosis not present

## 2015-02-27 DIAGNOSIS — I10 Essential (primary) hypertension: Secondary | ICD-10-CM | POA: Diagnosis not present

## 2015-02-27 DIAGNOSIS — I83811 Varicose veins of right lower extremities with pain: Secondary | ICD-10-CM

## 2015-02-27 DIAGNOSIS — I82811 Embolism and thrombosis of superficial veins of right lower extremities: Secondary | ICD-10-CM | POA: Diagnosis not present

## 2015-02-27 DIAGNOSIS — Z9889 Other specified postprocedural states: Secondary | ICD-10-CM | POA: Insufficient documentation

## 2015-02-27 NOTE — Progress Notes (Signed)
Subjective:     Patient ID: [name], male   DOB: 06/03/1933, 82 y.o.   MRN: [id number]  HPI This 82 year old male returns 1 week post-laser ablation right great saphenous vein for gross reflux with painful varicosities and swelling. Patient states he has had minimal discomfort in the right leg since the procedure. His legs do get heavy and achy as the day progresses. He did take his ibuprofen as instructed and wear elastic compression stockings.  Past Medical History  Diagnosis Date  . Hypertension   . Varicose veins   . Hyperlipidemia   . Cancer [facility])     prostate    Social History  Substance Use Topics  . Smoking status: Former Smoker    Quit date: 11/14/1978  . Smokeless tobacco: Current User    Types: Chew  . Alcohol Use: No    No family history on file.  Allergies  Allergen Reactions  . Penicillins Rash     Current outpatient prescriptions:  .  ALPRAZolam (XANAX) 0.25 MG tablet, Take 0.25 mg by mouth at bedtime as needed for anxiety., Disp: , Rfl:  .  amLODipine (NORVASC) 5 MG tablet, Take 5 mg by mouth daily., Disp: , Rfl:  .  celecoxib (CELEBREX) 200 MG capsule, Take 200 mg by mouth daily., Disp: , Rfl:  .  Cholecalciferol (VITAMIN D-3 PO), Take by mouth daily., Disp: , Rfl:  .  fenofibrate micronized (LOFIBRA) 200 MG capsule, Take 200 mg by mouth daily before breakfast., Disp: , Rfl:  .  Fish Oil-Cholecalciferol (FISH OIL + D3) 1000-1000 MG-UNIT CAPS, Take by mouth daily., Disp: , Rfl:  .  labetalol (NORMODYNE) 200 MG tablet, Take 200 mg by mouth 2 (two) times daily., Disp: , Rfl:  .  MULTIPLE VITAMINS PO, Take by mouth daily., Disp: , Rfl:  .  olmesartan-hydrochlorothiazide (BENICAR HCT) 40-25 MG tablet, Take 1 tablet by mouth daily., Disp: , Rfl:  .  oxyCODONE-acetaminophen (PERCOCET/ROXICET) 5-325 MG tablet, Take 1 tablet by mouth daily as needed., Disp: , Rfl: 0 .  pravastatin (PRAVACHOL) 40 MG tablet, Take 40 mg by mouth daily., Disp: , Rfl:   Filed  Vitals:   02/27/15 1420  BP: 111/70  Pulse: 78  Temp: 97.8 F (36.6 C)  Resp: 16  Height: 6' (1.829 m)  Weight: 182 lb (82.555 kg)  SpO2: 97%    Body mass index is 24.68 kg/(m^2).           Review of Systems  Denies chest pain, dyspnea on exertion, PND, orthopnea, hemoptysis, claudication.     Objective:   Physical Exam BP 111/70 mmHg  Pulse 78  Temp(Src) 97.8 F (36.6 C)  Resp 16  Ht 6' (1.829 m)  Wt 182 lb (82.555 kg)  BMI 24.68 kg/m2  SpO2 97%   Gen. Well-developed well-nourished male in no apparent distress alert and oriented 3 Lungs no rhonchi or wheezing Right leg with mild ecchymosis along the course of the great saphenous vein medially down to the knee level. Entrance site has healed nicely. No distal edema noted. 3+ dorsalis pedis pulse palpable. Bulging varicosities in the posterior calf.    today I ordered a venous duplex exam of the right leg which I reviewed and interpreted. There is no DVT. There is total closure of the right great saphenous vein up to near the saphenofemoral junction. The small saphenous vein communicates with the great saphenous vein and there is some superficial noncompressible thrombus in the mid to proximal small saphenous  vein suggestive of superficial thrombophlebitis     Assessment:      successful laser ablation right great saphenous vein for painful varicosities with gross reflux     Plan:      return in 3 months to evaluate need for stab phlebectomy of residual painful varicosities

## 2015-03-06 ENCOUNTER — Encounter (HOSPITAL_COMMUNITY): Payer: Medicare Other

## 2015-03-06 ENCOUNTER — Ambulatory Visit: Payer: Medicare Other | Admitting: Vascular Surgery

## 2015-05-02 ENCOUNTER — Other Ambulatory Visit (HOSPITAL_COMMUNITY): Payer: Self-pay | Admitting: *Deleted

## 2015-05-02 NOTE — Pre-Procedure Instructions (Signed)
    [name]  05/02/2015      [id number] [place] [facility] [address] [address]Phone: [phone] Fax: [phone]   Your procedure is scheduled on 05-04-2015  .Thursday   [facility]   Call this number if you have problems the morning of surgery:  [phone]   Remember:  Do not eat food or drink liquids after midnight.   Take these medicines the morning of surgery with A SIP OF WATER Albuterol inhaler if needed,Amlodipine(Norvasc),Fenofibrate,Labetalol(Normodyne),     Do not wear jewelry  Do not wear lotions, powders, or perfumes.  You may not wear deodorant.     .  Do not shave 48 hours prior to surgery.  Men may shave face and neck.   Do not bring valuables to the hospital.  [facility] or [facility].  Contacts, dentures or bridgework may not be worn into surgery.  Leave your suitcase in the car.  After surgery it may be brought to your room.  For patients admitted to the hospital, discharge time will be determined by your treatment team.  Patients discharged the day of surgery will not be allowed to drive home.    Special instructions: See attached sheet for instructions on [employer]  Please read over the following fact sheets that you were given. Pain Booklet, Coughing and Deep Breathing and Surgical Site Infection Prevention

## 2015-05-03 ENCOUNTER — Encounter (HOSPITAL_COMMUNITY)
Admission: RE | Admit: 2015-05-03 | Discharge: 2015-05-03 | Disposition: A | Discharge status: Home / Self Care | Payer: Medicare Other | Source: Ambulatory Visit | Attending: Orthopedic Surgery | Admitting: Orthopedic Surgery

## 2015-05-03 ENCOUNTER — Encounter (HOSPITAL_COMMUNITY): Payer: Self-pay

## 2015-05-03 HISTORY — DX: Frequency of micturition: R35.0

## 2015-05-03 HISTORY — DX: Unspecified osteoarthritis, unspecified site: M19.90

## 2015-05-03 HISTORY — DX: Anxiety disorder, unspecified: F41.9

## 2015-05-03 HISTORY — DX: Depression, unspecified: F32.A

## 2015-05-03 HISTORY — DX: Major depressive disorder, single episode, unspecified: F32.9

## 2015-05-03 LAB — CBC
HCT: 40.2 % (ref 39.0–52.0)
HEMOGLOBIN: 13.8 g/dL (ref 13.0–17.0)
MCH: 33.1 pg (ref 26.0–34.0)
MCHC: 34.3 g/dL (ref 30.0–36.0)
MCV: 96.4 fL (ref 78.0–100.0)
Platelets: 125 10*3/uL — ABNORMAL LOW (ref 150–400)
RBC: 4.17 MIL/uL — ABNORMAL LOW (ref 4.22–5.81)
RDW: 14.2 % (ref 11.5–15.5)
WBC: 5.8 10*3/uL (ref 4.0–10.5)

## 2015-05-03 LAB — BASIC METABOLIC PANEL
ANION GAP: 9 (ref 5–15)
BUN: 22 mg/dL — ABNORMAL HIGH (ref 6–20)
CALCIUM: 9.9 mg/dL (ref 8.9–10.3)
CO2: 26 mmol/L (ref 22–32)
Chloride: 104 mmol/L (ref 101–111)
Creatinine, Ser: 1.32 mg/dL — ABNORMAL HIGH (ref 0.61–1.24)
GFR, EST AFRICAN AMERICAN: 58 mL/min — AB (ref >60)
GFR, EST NON AFRICAN AMERICAN: 50 mL/min — AB (ref >60)
GLUCOSE: 115 mg/dL — AB (ref 65–99)
Potassium: 3.8 mmol/L (ref 3.5–5.1)
SODIUM: 139 mmol/L (ref 135–145)

## 2015-05-03 LAB — SURGICAL PCR SCREEN
MRSA, PCR: NEGATIVE
STAPHYLOCOCCUS AUREUS: NEGATIVE

## 2015-05-03 MED ORDER — TRANEXAMIC ACID 1000 MG/10ML IV SOLN
1000.0000 mg | INTRAVENOUS | Status: AC
  Administered 2015-05-04: 1000 mg via INTRAVENOUS
  Filled 2015-05-03: qty 10

## 2015-05-03 NOTE — Progress Notes (Signed)
Pt. Denies any history of heart problems,no cardiac testing of any type.

## 2015-05-04 ENCOUNTER — Encounter (HOSPITAL_COMMUNITY)
Admission: RE | Disposition: A | Discharge status: Skilled Nursing Facility | Payer: Self-pay | Source: Ambulatory Visit | Attending: Orthopedic Surgery

## 2015-05-04 ENCOUNTER — Inpatient Hospital Stay (HOSPITAL_COMMUNITY): Payer: Medicare Other | Admitting: Anesthesiology

## 2015-05-04 ENCOUNTER — Inpatient Hospital Stay (HOSPITAL_COMMUNITY)
Admission: RE | Admit: 2015-05-04 | Discharge: 2015-05-08 | DRG: 483 | Disposition: A | Discharge status: Skilled Nursing Facility | Payer: Medicare Other | Source: Ambulatory Visit | Attending: Orthopedic Surgery | Admitting: Orthopedic Surgery

## 2015-05-04 ENCOUNTER — Encounter (HOSPITAL_COMMUNITY): Payer: Self-pay | Admitting: *Deleted

## 2015-05-04 DIAGNOSIS — Z87891 Personal history of nicotine dependence: Secondary | ICD-10-CM | POA: Diagnosis not present

## 2015-05-04 DIAGNOSIS — I10 Essential (primary) hypertension: Secondary | ICD-10-CM | POA: Diagnosis present

## 2015-05-04 DIAGNOSIS — M12812 Other specific arthropathies, not elsewhere classified, left shoulder: Secondary | ICD-10-CM | POA: Diagnosis present

## 2015-05-04 DIAGNOSIS — M75102 Unspecified rotator cuff tear or rupture of left shoulder, not specified as traumatic: Secondary | ICD-10-CM | POA: Diagnosis present

## 2015-05-04 DIAGNOSIS — E785 Hyperlipidemia, unspecified: Secondary | ICD-10-CM | POA: Diagnosis present

## 2015-05-04 DIAGNOSIS — Z8546 Personal history of malignant neoplasm of prostate: Secondary | ICD-10-CM | POA: Diagnosis not present

## 2015-05-04 DIAGNOSIS — Z79899 Other long term (current) drug therapy: Secondary | ICD-10-CM

## 2015-05-04 DIAGNOSIS — Z96652 Presence of left artificial knee joint: Secondary | ICD-10-CM | POA: Diagnosis present

## 2015-05-04 DIAGNOSIS — F419 Anxiety disorder, unspecified: Secondary | ICD-10-CM | POA: Diagnosis present

## 2015-05-04 DIAGNOSIS — Z791 Long term (current) use of non-steroidal anti-inflammatories (NSAID): Secondary | ICD-10-CM

## 2015-05-04 DIAGNOSIS — Z96619 Presence of unspecified artificial shoulder joint: Secondary | ICD-10-CM

## 2015-05-04 DIAGNOSIS — Z96612 Presence of left artificial shoulder joint: Secondary | ICD-10-CM

## 2015-05-04 HISTORY — DX: Unspecified hearing loss, unspecified ear: H91.90

## 2015-05-04 HISTORY — PX: REVERSE SHOULDER ARTHROPLASTY: SHX5054

## 2015-05-04 SURGERY — ARTHROPLASTY, SHOULDER, TOTAL, REVERSE
Anesthesia: General | Site: Shoulder | Laterality: Left

## 2015-05-04 MED ORDER — ALBUTEROL SULFATE (2.5 MG/3ML) 0.083% IN NEBU: 2.5000 mg | INHALATION_SOLUTION | RESPIRATORY_TRACT | Status: DC | PRN

## 2015-05-04 MED ORDER — CEFAZOLIN SODIUM-DEXTROSE 2-4 GM/100ML-% IV SOLN
2.0000 g | Freq: Four times a day (QID) | INTRAVENOUS | Status: AC
  Administered 2015-05-04 – 2015-05-05 (×3): 2 g via INTRAVENOUS
  Filled 2015-05-04 (×3): qty 100

## 2015-05-04 MED ORDER — LIDOCAINE-EPINEPHRINE (PF) 1.5 %-1:200000 IJ SOLN
INTRAMUSCULAR | Status: DC | PRN
  Administered 2015-05-04: 10 mL via PERINEURAL

## 2015-05-04 MED ORDER — METOCLOPRAMIDE HCL 5 MG PO TABS: 5.0000 mg | ORAL_TABLET | Freq: Three times a day (TID) | ORAL | Status: DC | PRN

## 2015-05-04 MED ORDER — FENTANYL CITRATE (PF) 250 MCG/5ML IJ SOLN
INTRAMUSCULAR | Status: AC
  Filled 2015-05-04: qty 5

## 2015-05-04 MED ORDER — LACTATED RINGERS IV SOLN
INTRAVENOUS | Status: DC
  Administered 2015-05-04: 75 mL/h via INTRAVENOUS
  Administered 2015-05-05: 01:00:00 via INTRAVENOUS

## 2015-05-04 MED ORDER — PROPOFOL 10 MG/ML IV BOLUS
INTRAVENOUS | Status: DC | PRN
  Administered 2015-05-04: 100 mg via INTRAVENOUS

## 2015-05-04 MED ORDER — BUPIVACAINE HCL (PF) 0.5 % IJ SOLN
INTRAMUSCULAR | Status: DC | PRN
  Administered 2015-05-04: 20 mL via PERINEURAL

## 2015-05-04 MED ORDER — LIDOCAINE HCL 4 % EX SOLN
CUTANEOUS | Status: DC | PRN
  Administered 2015-05-04: 3 mL via TOPICAL

## 2015-05-04 MED ORDER — CHLORHEXIDINE GLUCONATE 4 % EX LIQD: 60.0000 mL | Freq: Once | CUTANEOUS | Status: DC

## 2015-05-04 MED ORDER — KETOROLAC TROMETHAMINE 15 MG/ML IJ SOLN
7.5000 mg | Freq: Four times a day (QID) | INTRAMUSCULAR | Status: AC
  Administered 2015-05-04 – 2015-05-05 (×4): 7.5 mg via INTRAVENOUS
  Filled 2015-05-04 (×4): qty 1

## 2015-05-04 MED ORDER — CEFAZOLIN SODIUM-DEXTROSE 2-4 GM/100ML-% IV SOLN
INTRAVENOUS | Status: AC
  Administered 2015-05-04: 2 g via INTRAVENOUS
  Filled 2015-05-04: qty 100

## 2015-05-04 MED ORDER — MIDAZOLAM HCL 2 MG/2ML IJ SOLN
INTRAMUSCULAR | Status: AC
  Administered 2015-05-04: 1 mg
  Filled 2015-05-04: qty 2

## 2015-05-04 MED ORDER — HYDROCHLOROTHIAZIDE 12.5 MG PO CAPS
12.5000 mg | ORAL_CAPSULE | Freq: Every day | ORAL | Status: DC
  Administered 2015-05-04 – 2015-05-08 (×5): 12.5 mg via ORAL
  Filled 2015-05-04 (×5): qty 1

## 2015-05-04 MED ORDER — ALPRAZOLAM 0.25 MG PO TABS: 0.2500 mg | ORAL_TABLET | Freq: Every evening | ORAL | Status: DC | PRN

## 2015-05-04 MED ORDER — BISACODYL 5 MG PO TBEC
5.0000 mg | DELAYED_RELEASE_TABLET | Freq: Every day | ORAL | Status: DC | PRN
  Administered 2015-05-05: 5 mg via ORAL
  Filled 2015-05-04: qty 1

## 2015-05-04 MED ORDER — 0.9 % SODIUM CHLORIDE (POUR BTL) OPTIME
TOPICAL | Status: DC | PRN
  Administered 2015-05-04: 1000 mL

## 2015-05-04 MED ORDER — ACETAMINOPHEN 325 MG PO TABS
650.0000 mg | ORAL_TABLET | Freq: Four times a day (QID) | ORAL | Status: DC | PRN
  Administered 2015-05-07: 650 mg via ORAL
  Filled 2015-05-04: qty 2

## 2015-05-04 MED ORDER — OLMESARTAN MEDOXOMIL-HCTZ 20-12.5 MG PO TABS: 1.0000 | ORAL_TABLET | Freq: Every day | ORAL | Status: DC

## 2015-05-04 MED ORDER — AMLODIPINE BESYLATE 5 MG PO TABS
5.0000 mg | ORAL_TABLET | Freq: Every day | ORAL | Status: DC
  Administered 2015-05-05 – 2015-05-08 (×4): 5 mg via ORAL
  Filled 2015-05-04 (×4): qty 1

## 2015-05-04 MED ORDER — IRBESARTAN 150 MG PO TABS
150.0000 mg | ORAL_TABLET | Freq: Every day | ORAL | Status: DC
  Administered 2015-05-04 – 2015-05-08 (×5): 150 mg via ORAL
  Filled 2015-05-04 (×5): qty 1

## 2015-05-04 MED ORDER — POLYETHYLENE GLYCOL 3350 17 G PO PACK
17.0000 g | PACK | Freq: Every day | ORAL | Status: DC | PRN
  Administered 2015-05-06: 17 g via ORAL
  Filled 2015-05-04 (×2): qty 1

## 2015-05-04 MED ORDER — ALBUTEROL SULFATE HFA 108 (90 BASE) MCG/ACT IN AERS: 1.0000 | INHALATION_SPRAY | RESPIRATORY_TRACT | Status: DC | PRN

## 2015-05-04 MED ORDER — PHENOL 1.4 % MT LIQD: 1.0000 | OROMUCOSAL | Status: DC | PRN

## 2015-05-04 MED ORDER — ONDANSETRON HCL 4 MG/2ML IJ SOLN
INTRAMUSCULAR | Status: DC | PRN
  Administered 2015-05-04: 4 mg via INTRAVENOUS

## 2015-05-04 MED ORDER — DOCUSATE SODIUM 100 MG PO CAPS
100.0000 mg | ORAL_CAPSULE | Freq: Two times a day (BID) | ORAL | Status: DC
Start: 2015-05-04 — End: 2015-05-08
  Administered 2015-05-04 – 2015-05-08 (×8): 100 mg via ORAL
  Filled 2015-05-04 (×9): qty 1

## 2015-05-04 MED ORDER — ALUM & MAG HYDROXIDE-SIMETH 200-200-20 MG/5ML PO SUSP: 30.0000 mL | ORAL | Status: DC | PRN

## 2015-05-04 MED ORDER — METHOCARBAMOL 500 MG PO TABS
500.0000 mg | ORAL_TABLET | Freq: Four times a day (QID) | ORAL | Status: DC | PRN
  Administered 2015-05-05 – 2015-05-08 (×7): 500 mg via ORAL
  Filled 2015-05-04 (×7): qty 1

## 2015-05-04 MED ORDER — ONDANSETRON HCL 4 MG PO TABS: 4.0000 mg | ORAL_TABLET | Freq: Four times a day (QID) | ORAL | Status: DC | PRN

## 2015-05-04 MED ORDER — LABETALOL HCL 200 MG PO TABS
200.0000 mg | ORAL_TABLET | Freq: Two times a day (BID) | ORAL | Status: DC
  Administered 2015-05-05 – 2015-05-08 (×7): 200 mg via ORAL
  Filled 2015-05-04 (×7): qty 1

## 2015-05-04 MED ORDER — ACETAMINOPHEN 650 MG RE SUPP: 650.0000 mg | Freq: Four times a day (QID) | RECTAL | Status: DC | PRN

## 2015-05-04 MED ORDER — DEXTROSE 5 % IV SOLN
10.0000 mg | INTRAVENOUS | Status: DC | PRN
  Administered 2015-05-04: 25 ug/min via INTRAVENOUS

## 2015-05-04 MED ORDER — OXYCODONE HCL 5 MG PO TABS
5.0000 mg | ORAL_TABLET | ORAL | Status: DC | PRN
  Administered 2015-05-05 (×2): 10 mg via ORAL
  Administered 2015-05-06: 5 mg via ORAL
  Administered 2015-05-06: 10 mg via ORAL
  Administered 2015-05-07 (×2): 5 mg via ORAL
  Administered 2015-05-08 (×3): 10 mg via ORAL
  Filled 2015-05-04 (×2): qty 2
  Filled 2015-05-04 (×2): qty 1
  Filled 2015-05-04: qty 2
  Filled 2015-05-04: qty 1
  Filled 2015-05-04 (×3): qty 2

## 2015-05-04 MED ORDER — LIDOCAINE HCL (CARDIAC) 20 MG/ML IV SOLN
INTRAVENOUS | Status: DC | PRN
  Administered 2015-05-04: 100 mg via INTRAVENOUS

## 2015-05-04 MED ORDER — HYDROMORPHONE HCL 1 MG/ML IJ SOLN
1.0000 mg | INTRAMUSCULAR | Status: DC | PRN
  Administered 2015-05-04 (×2): 1 mg via INTRAVENOUS
  Filled 2015-05-04 (×2): qty 1

## 2015-05-04 MED ORDER — SUCCINYLCHOLINE CHLORIDE 20 MG/ML IJ SOLN
INTRAMUSCULAR | Status: DC | PRN
  Administered 2015-05-04: 100 mg via INTRAVENOUS

## 2015-05-04 MED ORDER — DEXAMETHASONE SODIUM PHOSPHATE 10 MG/ML IJ SOLN
INTRAMUSCULAR | Status: DC | PRN
  Administered 2015-05-04: 8 mg via INTRAVENOUS

## 2015-05-04 MED ORDER — HYDROMORPHONE HCL 1 MG/ML IJ SOLN: 0.2500 mg | INTRAMUSCULAR | Status: DC | PRN

## 2015-05-04 MED ORDER — KETOROLAC TROMETHAMINE 15 MG/ML IJ SOLN
INTRAMUSCULAR | Status: AC
  Administered 2015-05-04: 7.5 mg via INTRAVENOUS
  Filled 2015-05-04: qty 1

## 2015-05-04 MED ORDER — LACTATED RINGERS IV SOLN
INTRAVENOUS | Status: DC
  Administered 2015-05-04: 07:00:00 via INTRAVENOUS

## 2015-05-04 MED ORDER — FENTANYL CITRATE (PF) 100 MCG/2ML IJ SOLN
INTRAMUSCULAR | Status: AC
  Administered 2015-05-04: 50 ug
  Filled 2015-05-04: qty 2

## 2015-05-04 MED ORDER — ONDANSETRON HCL 4 MG/2ML IJ SOLN: 4.0000 mg | Freq: Four times a day (QID) | INTRAMUSCULAR | Status: DC | PRN

## 2015-05-04 MED ORDER — PRAVASTATIN SODIUM 40 MG PO TABS
40.0000 mg | ORAL_TABLET | Freq: Every day | ORAL | Status: DC
  Administered 2015-05-04 – 2015-05-07 (×4): 40 mg via ORAL
  Filled 2015-05-04 (×4): qty 1

## 2015-05-04 MED ORDER — LACTATED RINGERS IV SOLN
INTRAVENOUS | Status: DC | PRN
  Administered 2015-05-04 (×2): via INTRAVENOUS

## 2015-05-04 MED ORDER — METOCLOPRAMIDE HCL 5 MG/ML IJ SOLN: 5.0000 mg | Freq: Three times a day (TID) | INTRAMUSCULAR | Status: DC | PRN

## 2015-05-04 MED ORDER — ARTIFICIAL TEARS OP OINT
TOPICAL_OINTMENT | OPHTHALMIC | Status: DC | PRN
  Administered 2015-05-04: 1 via OPHTHALMIC

## 2015-05-04 MED ORDER — METHOCARBAMOL 1000 MG/10ML IJ SOLN
500.0000 mg | Freq: Four times a day (QID) | INTRAVENOUS | Status: DC | PRN
  Filled 2015-05-04: qty 5

## 2015-05-04 MED ORDER — CEFAZOLIN SODIUM-DEXTROSE 2-4 GM/100ML-% IV SOLN: 2.0000 g | INTRAVENOUS | Status: DC

## 2015-05-04 MED ORDER — MAGNESIUM CITRATE PO SOLN: 1.0000 | Freq: Once | ORAL | Status: DC | PRN

## 2015-05-04 MED ORDER — MENTHOL 3 MG MT LOZG
1.0000 | LOZENGE | OROMUCOSAL | Status: DC | PRN
  Filled 2015-05-04: qty 9

## 2015-05-04 MED ORDER — EPHEDRINE SULFATE 50 MG/ML IJ SOLN
INTRAMUSCULAR | Status: DC | PRN
  Administered 2015-05-04: 5 mg via INTRAVENOUS
  Administered 2015-05-04 (×2): 10 mg via INTRAVENOUS
  Administered 2015-05-04: 5 mg via INTRAVENOUS
  Administered 2015-05-04: 10 mg via INTRAVENOUS

## 2015-05-04 SURGICAL SUPPLY — 84 items
ADH SKN CLS APL DERMABOND .7 (GAUZE/BANDAGES/DRESSINGS) ×1
AID PSTN UNV HD RSTRNT DISP (MISCELLANEOUS) ×1
BASEPLATE GLENOID SHLDR SM (Shoulder) ×2 IMPLANT
BLADE SAW SGTL 83.5X18.5 (BLADE) ×3 IMPLANT
BRUSH FEMORAL CANAL (MISCELLANEOUS) IMPLANT
BSPLAT GLND SM PRFT SHLDR CA (Shoulder) ×1 IMPLANT
COVER SURGICAL LIGHT HANDLE (MISCELLANEOUS) ×3 IMPLANT
CUP SUT UNIV REVERS 39 NEU (Shoulder) ×2 IMPLANT
DERMABOND ADVANCED (GAUZE/BANDAGES/DRESSINGS) ×2
DERMABOND ADVANCED .7 DNX12 (GAUZE/BANDAGES/DRESSINGS) ×1 IMPLANT
DRAPE ORTHO SPLIT 77X108 STRL (DRAPES) ×6
DRAPE SURG 17X11 SM STRL (DRAPES) ×3 IMPLANT
DRAPE SURG ORHT 6 SPLT 77X108 (DRAPES) ×2 IMPLANT
DRAPE U-SHAPE 47X51 STRL (DRAPES) ×3 IMPLANT
DRILL BIT 7/64X5 (BIT) ×3 IMPLANT
DRSG AQUACEL AG ADV 3.5X10 (GAUZE/BANDAGES/DRESSINGS) ×3 IMPLANT
DRSG MEPILEX BORDER 4X8 (GAUZE/BANDAGES/DRESSINGS) IMPLANT
DURAPREP 26ML APPLICATOR (WOUND CARE) ×3 IMPLANT
ELECT BLADE 4.0 EZ CLEAN MEGAD (MISCELLANEOUS) ×3
ELECT CAUTERY BLADE 6.4 (BLADE) ×3 IMPLANT
ELECT REM PT RETURN 9FT ADLT (ELECTROSURGICAL) ×3
ELECTRODE BLDE 4.0 EZ CLN MEGD (MISCELLANEOUS) ×1 IMPLANT
ELECTRODE REM PT RTRN 9FT ADLT (ELECTROSURGICAL) ×1 IMPLANT
FACESHIELD WRAPAROUND (MASK) ×9 IMPLANT
FACESHIELD WRAPAROUND OR TEAM (MASK) ×3 IMPLANT
GLENOSPHERE LAT 39+4 SHOULDER (Shoulder) ×2 IMPLANT
GLOVE BIO SURGEON STRL SZ7.5 (GLOVE) ×3 IMPLANT
GLOVE BIO SURGEON STRL SZ8 (GLOVE) ×3 IMPLANT
GLOVE EUDERMIC 7 POWDERFREE (GLOVE) ×3 IMPLANT
GLOVE SS BIOGEL STRL SZ 7.5 (GLOVE) ×1 IMPLANT
GLOVE SUPERSENSE BIOGEL SZ 7.5 (GLOVE) ×2
GOWN STRL REUS W/ TWL LRG LVL3 (GOWN DISPOSABLE) IMPLANT
GOWN STRL REUS W/ TWL XL LVL3 (GOWN DISPOSABLE) ×2 IMPLANT
GOWN STRL REUS W/TWL LRG LVL3 (GOWN DISPOSABLE)
GOWN STRL REUS W/TWL XL LVL3 (GOWN DISPOSABLE) ×6
HANDPIECE INTERPULSE COAX TIP (DISPOSABLE)
INSERT HUMERAL MED 39/ +3 (Shoulder) IMPLANT
INSERT MEDIUM HUMERAL 39/ +3 (Shoulder) ×2 IMPLANT
KIT BASIN OR (CUSTOM PROCEDURE TRAY) ×3 IMPLANT
KIT ROOM TURNOVER OR (KITS) ×3 IMPLANT
MANIFOLD NEPTUNE II (INSTRUMENTS) ×3 IMPLANT
NDL 1/2 CIR CATGUT .05X1.09 (NEEDLE) ×1 IMPLANT
NDL HYPO 25GX1X1/2 BEV (NEEDLE) IMPLANT
NDL SUT 6 .5 CRC .975X.05 MAYO (NEEDLE) IMPLANT
NEEDLE 1/2 CIR CATGUT .05X1.09 (NEEDLE) ×3 IMPLANT
NEEDLE HYPO 25GX1X1/2 BEV (NEEDLE) IMPLANT
NEEDLE MAYO TAPER (NEEDLE)
NS IRRIG 1000ML POUR BTL (IV SOLUTION) ×3 IMPLANT
PACK SHOULDER (CUSTOM PROCEDURE TRAY) ×3 IMPLANT
PAD ARMBOARD 7.5X6 YLW CONV (MISCELLANEOUS) ×6 IMPLANT
PASSER SUT SWANSON 36MM LOOP (INSTRUMENTS) IMPLANT
PRESSURIZER FEMORAL UNIV (MISCELLANEOUS) IMPLANT
RESTRAINT HEAD UNIVERSAL NS (MISCELLANEOUS) ×3 IMPLANT
SCREW CENTRAL NONLOCK 25MM (Screw) ×2 IMPLANT
SCREW LOCK GLENOID UNI PERI (Screw) ×2 IMPLANT
SCREW LOCK PERIPHERAL 30MM (Shoulder) ×2 IMPLANT
SET HNDPC FAN SPRY TIP SCT (DISPOSABLE) IMPLANT
SET PIN UNIVERSAL REVERSE (SET/KITS/TRAYS/PACK) ×2 IMPLANT
SLING ARM FOAM STRAP LRG (SOFTGOODS) ×2 IMPLANT
SPACER REVERSE UNI 39/ +6MM (Shoulder) ×1 IMPLANT
SPACER SHLD UNI REV 39 +6 (Shoulder) ×1 IMPLANT
SPONGE LAP 18X18 X RAY DECT (DISPOSABLE) ×4 IMPLANT
SPONGE LAP 4X18 X RAY DECT (DISPOSABLE) IMPLANT
STEM UNI REVERS SZ 10 CAP COAT (Shoulder) ×2 IMPLANT
SUCTION FRAZIER HANDLE 10FR (MISCELLANEOUS) ×2
SUCTION TUBE FRAZIER 10FR DISP (MISCELLANEOUS) ×1 IMPLANT
SUT BONE WAX W31G (SUTURE) ×2 IMPLANT
SUT FIBERWIRE #2 38 T-5 BLUE (SUTURE) ×6
SUT MNCRL AB 3-0 PS2 18 (SUTURE) ×1 IMPLANT
SUT MON AB 2-0 CT1 36 (SUTURE) ×5 IMPLANT
SUT VIC AB 1 CT1 27 (SUTURE) ×3
SUT VIC AB 1 CT1 27XBRD ANBCTR (SUTURE) ×1 IMPLANT
SUT VIC AB 2-0 CT1 27 (SUTURE) ×6
SUT VIC AB 2-0 CT1 TAPERPNT 27 (SUTURE) IMPLANT
SUT VIC AB 2-0 SH 27 (SUTURE)
SUT VIC AB 2-0 SH 27X BRD (SUTURE) IMPLANT
SUTURE FIBERWR #2 38 T-5 BLUE (SUTURE) ×2 IMPLANT
SYR 30ML SLIP (SYRINGE) ×3 IMPLANT
SYR CONTROL 10ML LL (SYRINGE) IMPLANT
TOWEL OR 17X24 6PK STRL BLUE (TOWEL DISPOSABLE) ×3 IMPLANT
TOWEL OR 17X26 10 PK STRL BLUE (TOWEL DISPOSABLE) ×3 IMPLANT
TOWER CARTRIDGE SMART MIX (DISPOSABLE) IMPLANT
TRAY FOLEY CATH 16FRSI W/METER (SET/KITS/TRAYS/PACK) IMPLANT
WATER STERILE IRR 1000ML POUR (IV SOLUTION) ×3 IMPLANT

## 2015-05-04 NOTE — Discharge Instructions (Signed)

## 2015-05-04 NOTE — Op Note (Signed)
05/04/2015  10:57 AM  PATIENT:   [name]  82 y.o. male  PRE-OPERATIVE DIAGNOSIS:  left shoulder rotator cuff tear  arthropathy  POST-OPERATIVE DIAGNOSIS:  same  PROCEDURE:  L RSA,#10 stem, +6 spacer, +3 poly, 39 +4 glenosphere  SURGEON:  Yalena Colon, Metta Clines M.D.  ASSISTANTS: Shuford pac   ANESTHESIA:   GET + ISB  EBL: 250  SPECIMEN:  none  Drains: none   PATIENT DISPOSITION:  PACU - hemodynamically stable.    PLAN OF CARE: Admit to inpatient   Dictation# [id number]   Contact # [phone]

## 2015-05-04 NOTE — Op Note (Signed)
NAMEKELDAN, EPLIN NO.:  [id number]  MEDICAL RECORD NO.:  [id number]  LOCATION:  [facility]                         FACILITY:  [facility]  PHYSICIAN:  Metta Clines. Omnia Dollinger, M.D.  DATE OF BIRTH:  04-Jan-1934  DATE OF PROCEDURE:  05/04/2015 DATE OF DISCHARGE:                              OPERATIVE REPORT   PREOPERATIVE DIAGNOSIS:  Left shoulder rotator cuff tear arthropathy.  POSTOPERATIVE DIAGNOSIS:  Left shoulder rotator cuff tear arthropathy.  PROCEDURE:  Left reverse shoulder arthroplasty utilizing a press-fit, size 10 DePuy stem with a +6 spacer, +3 poly and a 39+ 4 glenosphere.  SURGEON:  Metta Clines. Charleen Madera, M.D.  Terrence DupontOlivia Mackie A. Shuford, P.A.-C.  ANESTHESIA:  General endotracheal as well as an interscalene block.  ESTIMATED BLOOD LOSS:  250 mL.  DRAINS:  None.  HISTORY:  Mr. Burgueno is a 82 year old gentleman who has had chronic and progressively increasing bilateral shoulder pain, left more problematic than the right with global weakness and examination showing actually quite good motion clinically, but profound weakness and great difficulty reaching overhead and marked functional limitations.  His plain radiographs show superior head migration with erosion of the glenoid and characteristic changes consistent with rotator cuff tear arthropathy. Due to his increasing pain and functional limitations, he is brought to the operating room at this time for planned left reverse shoulder arthroplasty as described below.  Preoperatively, I counseled Mr. Heyliger regarding treatment options and potential risks versus benefits thereof.  Possible surgical complications were all reviewed including bleeding, infection, neurovascular injury, persistent pain, loss of motion, failure of the implant, anesthetic complication and possible need for additional surgery.  He understands and accepts and agrees with our planned procedure.  PROCEDURE IN DETAIL:  After undergoing  routine preop evaluation, the patient received prophylactic antibiotics and an interscalene block was established in the holding area by the Anesthesia Department.  Placed supine on the operating table and underwent smooth induction of a general endotracheal anesthesia.  Placed in the beach-chair position and appropriately padded and protected.  Left shoulder girdle region was sterilely prepped and draped in standard fashion.  Time-out was called. An anterior deltopectoral approach to the left shoulder was made through a 10-cm incision.  Skin flaps were elevated and mobilized.  Dissection was carried deeply.  Cephalic vein identified and taken laterally with the deltoid.  Pec major retracted medially.  Adhesions were divided. Self-retaining retractors were placed.  The conjoined tendon was mobilized and retracted medially and the upper centimeter of the pec major was tenotomized to enhance exposure.  There have been previous ruptured long head biceps tendon.  We dissected the remnant of the anterior cuff tissues from the region of lesser tuberosity.  Really, the subscapularis was found to be completely deficient.  There was just a veil of connective tissue in this area, but throwing no contractile tissues.  We divided the capsular attachments from the anterior-inferior and inferior aspect of the humeral head and allowed to deliver the head through the wound.  There was marked erosive change of the entire humeral head consistent with rotator cuff tear arthropathy.  At this point, we outlined our proposed humeral head resection using the extramedullary guide  and resected our humeral head 135-degree angle at approximately 20 degrees of retroversion.  Once this was completed, we placed a metal cap over the surface of the proximal humerus and then exposed the glenoid with combination of Fukuda, pitchfork, and snake tongue retractors.  The glenoid was then visualized circumferentially and we  removed the glenoid labrum and associated capsular tissues to gain complete exposure of the periphery of the glenoid.  At this point, then, we placed a guidepin into the center of the glenoid.  This was then reamed with a central pilot reamer followed by the peripheral reamer for the small glenoid base plate.  Once this was completed to our satisfaction, removed residual bone debris and soft tissues.  Our small base plate was then impacted into position.  We placed a 25-mm central lag screw and then locking screws inferiorly and superiorly, which all obtained excellent bony purchase and fixation.  At this point, we then returned our attention to the proximal humerus and broached the humerus up to a size 10 and off our size 10 broach, we then reamed the metaphyseal segment maintaining approximately 20 degrees of retroversion.  Once we had prepared the humerus, we then applied our glenosphere to the glenoid base plate.  This was then impacted into position and we confirmed that the glenoid was securely seated on the base plate.  We then returned our attention back to the humeral metaphysis where we assembled our size 10 stem, 135-degree angulation of the metaphysis and then inserted our stem with press-fit fixation with excellent fixation.  We then performed a series of trial reductions and ultimately decided that the +6 metal spacer and +3 poly provided the appropriate soft tissue balance.  The final implants were then affixed to our humeral stem.  The final reduction was then performed.  The shoulder motion was excellent with good stability, excellent soft tissue balance and overall position, alignment and stability was much to our satisfaction.  The wound was then copiously irrigated.  Hemostasis was obtained.  The anterior remnant of the subscapularis was then repaired back to the cuff of tissue on the humeral metaphyseal region with figure- of-eight FiberWire sutures.  #1 Vicryl was  used to close the deltopectoral interval, figure-of-eight sutures.  2-0 Vicryl was used in the subcu layer and intracuticular 3-0 Monocryl for the skin followed by Dermabond and an Aquacel dressing.  Left arm was then placed in a sling. The patient was awakened, extubated, and taken to the recovery room in stable condition.  Reather Laurence Shuford, PA-C was used as an Environmental consultant throughout this case, was essential for help with positioning the patient, positioning the extremity, management of the tissue retractors, tissue manipulation, implantation of prosthesis, wound closure, and intraoperative decision making.     Metta Clines. Baby Gieger, M.D.     KMS/MEDQ  D:  05/04/2015  T:  05/04/2015  Job:  [id number]

## 2015-05-04 NOTE — Transfer of Care (Signed)
Immediate Anesthesia Transfer of Care Note  Patient: [name]  Procedure(s) Performed: Procedure(s): LEFT REVERSE SHOULDER ARTHROPLASTY (Left)  Patient Location: PACU  Anesthesia Type:General  Level of Consciousness: awake, alert , oriented and patient cooperative  Airway & Oxygen Therapy: Patient Spontanous Breathing and Patient connected to nasal cannula oxygen  Post-op Assessment: Report given to RN, Post -op Vital signs reviewed and stable and Patient moving all extremities  Post vital signs: Reviewed and stable  Last Vitals:  Filed Vitals:   05/04/15 0845 05/04/15 0850  BP: 105/58 118/58  Pulse: 73 71  Temp:    Resp: 13 13    Complications: No apparent anesthesia complications

## 2015-05-04 NOTE — Anesthesia Preprocedure Evaluation (Addendum)
Anesthesia Evaluation  Patient identified by MRN, date of birth, ID band Patient awake    Reviewed: Allergy & Precautions, NPO status , Patient's Chart, lab work & pertinent test results  Airway Mallampati: II  TM Distance: >3 FB Neck ROM: Full    Dental  (+) Edentulous Upper, Edentulous Lower   Pulmonary neg pulmonary ROS, former smoker,    Pulmonary exam normal breath sounds clear to auscultation       Cardiovascular hypertension, Pt. on medications Normal cardiovascular exam Rhythm:Regular Rate:Normal     Neuro/Psych Anxiety negative neurological ROS     GI/Hepatic negative GI ROS, Neg liver ROS,   Endo/Other  negative endocrine ROS  Renal/GU negative Renal ROS  negative genitourinary   Musculoskeletal negative musculoskeletal ROS (+)   Abdominal   Peds negative pediatric ROS (+)  Hematology negative hematology ROS (+)   Anesthesia Other Findings   Reproductive/Obstetrics negative OB ROS                            Anesthesia Physical Anesthesia Plan  ASA: II  Anesthesia Plan: General   Post-op Pain Management: GA combined w/ Regional for post-op pain   Induction: Intravenous  Airway Management Planned: Oral ETT  Additional Equipment:   Intra-op Plan:   Post-operative Plan: Extubation in OR  Informed Consent: I have reviewed the patients History and Physical, chart, labs and discussed the procedure including the risks, benefits and alternatives for the proposed anesthesia with the patient or authorized representative who has indicated his/her understanding and acceptance.   Dental advisory given  Plan Discussed with: CRNA and Surgeon  Anesthesia Plan Comments:         Anesthesia Quick Evaluation

## 2015-05-04 NOTE — Anesthesia Procedure Notes (Addendum)
Anesthesia Regional Block:  Interscalene brachial plexus block  Pre-Anesthetic Checklist: ,, timeout performed, Correct Patient, Correct Site, Correct Laterality, Correct Procedure, Correct Position, site marked, Risks and benefits discussed,  Surgical consent,  Pre-op evaluation,  At surgeon's request and post-op pain management  Laterality: Left  Prep: chloraprep       Needles:  Injection technique: Single-shot  Needle Type: Echogenic Stimulator Needle     Needle Length: 9cm 9 cm Needle Gauge: 21 and 21 G    Additional Needles:  Procedures: ultrasound guided (picture in chart) Interscalene brachial plexus block Narrative:  Injection made incrementally with aspirations every 5 mL.  Performed by: Personally  Anesthesiologist: ROSE, Iona Beard  Additional Notes: Patient tolerated the procedure well without complications   Procedure Name: Intubation Date/Time: 05/04/2015 9:20 AM Performed by: Suzy Bouchard Pre-anesthesia Checklist: Patient identified, Emergency Drugs available, Suction available and Patient being monitored Patient Re-evaluated:Patient Re-evaluated prior to inductionOxygen Delivery Method: Circle System Utilized Preoxygenation: Pre-oxygenation with 100% oxygen Intubation Type: IV induction Ventilation: Mask ventilation without difficulty and Oral airway inserted - appropriate to patient size Laryngoscope Size: Mac and 4 Grade View: Grade I Tube type: Oral Tube size: 7.5 mm Number of attempts: 1 Airway Equipment and Method: Stylet and Oral airway Placement Confirmation: ETT inserted through vocal cords under direct vision,  positive ETCO2 and breath sounds checked- equal and bilateral Secured at: 23 cm Tube secured with: Tape Dental Injury: Teeth and Oropharynx as per pre-operative assessment

## 2015-05-04 NOTE — Addendum Note (Signed)
Addendum  created 05/04/15 1232 by Suzy Bouchard, CRNA   Modules edited: Anesthesia Events, Narrator   Narrator:  Narrator: Event Log Edited

## 2015-05-04 NOTE — Progress Notes (Signed)
Utilization review completed.  

## 2015-05-04 NOTE — H&P (Signed)
[name]    Chief Complaint: left shoulder rotator cuff arthropathy HPI: The patient is a 82 y.o. male with end stage left shoulder rotator cuff tear arthropathy  Past Medical History  Diagnosis Date  . Hypertension   . Varicose veins   . Hyperlipidemia   . Cancer [facility])     prostate  . Depression   . Anxiety   . Urinary frequency   . Arthritis     Past Surgical History  Procedure Laterality Date  . Prostate surgery    . Hernia repair    . Colonoscopy    . Back surgery    . Eye surgery Bilateral     cataract removal  . Inner ear surgery Left     busted ear drum  . Penile prothesis    . Joint replacement Left     knee replacement    History reviewed. No pertinent family history.  Social History:  reports that he quit smoking about 36 years ago. His smokeless tobacco use includes Chew. He reports that he does not drink alcohol or use illicit drugs.   Medications Prior to Admission  Medication Sig Dispense Refill  . albuterol (PROVENTIL HFA;VENTOLIN HFA) 108 (90 Base) MCG/ACT inhaler Inhale 1-2 puffs into the lungs every 4 (four) hours as needed for wheezing.    Marland Kitchen ALPRAZolam (XANAX) 0.25 MG tablet Take 0.25 mg by mouth at bedtime as needed for anxiety.    Marland Kitchen amLODipine (NORVASC) 5 MG tablet Take 5 mg by mouth daily.    . celecoxib (CELEBREX) 200 MG capsule Take 200 mg by mouth daily.    . Cholecalciferol (VITAMIN D-3 PO) Take 1 tablet by mouth daily.     . fenofibrate micronized (LOFIBRA) 200 MG capsule Take 200 mg by mouth daily before breakfast.    . Fish Oil-Cholecalciferol (FISH OIL + D3) 1000-1000 MG-UNIT CAPS Take 1 capsule by mouth daily.     Marland Kitchen labetalol (NORMODYNE) 200 MG tablet Take 200 mg by mouth 2 (two) times daily.    . MULTIPLE VITAMINS PO Take 1 tablet by mouth daily.     Marland Kitchen olmesartan-hydrochlorothiazide (BENICAR HCT) 20-12.5 MG tablet Take 1 tablet by mouth daily.    Marland Kitchen oxyCODONE-acetaminophen (PERCOCET/ROXICET) 5-325 MG tablet Take 1 tablet by  mouth at bedtime.   0  . pravastatin (PRAVACHOL) 40 MG tablet Take 40 mg by mouth daily.       Physical Exam: global shoulder weakness with nearly full motion but significant pain as noted at recent office visits  Vitals  Temp:  [97.8 F (36.6 C)] 97.8 F (36.6 C) (04/20 0702) Pulse Rate:  [82] 82 (04/20 0702) Resp:  [20] 20 (04/20 0702) BP: (111)/(73) 111/73 mmHg (04/20 0702) SpO2:  [98 %] 98 % (04/20 0702) Weight:  [82.101 kg (181 lb)] 82.101 kg (181 lb) (04/20 0702)  Assessment/Plan  Impression: left shoulder rotator cuff arthropathy  Plan of Action: Procedure(s): LEFT REVERSE SHOULDER ARTHROPLASTY  [name] 05/04/2015, 8:40 AM Contact # [phone]

## 2015-05-04 NOTE — Anesthesia Postprocedure Evaluation (Signed)
Anesthesia Post Note  Patient: [name]  Procedure(s) Performed: Procedure(s) (LRB): LEFT REVERSE SHOULDER ARTHROPLASTY (Left)  Patient location during evaluation: PACU Anesthesia Type: General and Regional Level of consciousness: awake and alert Pain management: pain level controlled Vital Signs Assessment: post-procedure vital signs reviewed and stable Respiratory status: spontaneous breathing, nonlabored ventilation, respiratory function stable and patient connected to nasal cannula oxygen Cardiovascular status: blood pressure returned to baseline and stable Postop Assessment: no signs of nausea or vomiting Anesthetic complications: no    Last Vitals:  Filed Vitals:   05/04/15 0850 05/04/15 1115  BP: 118/58   Pulse: 71   Temp:  36.4 C  Resp: 13     Last Pain: There were no vitals filed for this visit.               Amylia Collazos S

## 2015-05-05 ENCOUNTER — Encounter (HOSPITAL_COMMUNITY): Payer: Self-pay | Admitting: General Practice

## 2015-05-05 MED ORDER — POLYETHYLENE GLYCOL 3350 17 G PO PACK: 17.0000 g | PACK | Freq: Every day | ORAL | Status: DC | PRN

## 2015-05-05 MED ORDER — OXYCODONE-ACETAMINOPHEN 5-325 MG PO TABS: 1.0000 | ORAL_TABLET | Freq: Every day | ORAL | Status: DC

## 2015-05-05 NOTE — NC FL2 (Signed)
[place] MEDICAID FL2 LEVEL OF CARE SCREENING TOOL     IDENTIFICATION  Patient Name: [name] Birthdate: 11-16-1933 Sex: male Admission Date (Current Location): 05/04/2015  [facility] and [place] Number:  [employer] and Address:  [facility] [place] [address], [place], [place] [address]      Provider Number: [id number]  Attending Physician Name and Address:  Justice Britain, MD  Relative Name and Phone Number:  Alek, Borges Daughter [phone]    Current Level of Care: Hospital Recommended Level of Care: [place] Prior Approval Number:    Date Approved/Denied:   PASRR Number: [id number]  Discharge Plan: SNF    Current Diagnoses: Patient Active Problem List   Diagnosis Date Noted  . S/p reverse total shoulder arthroplasty 05/04/2015  . Varicose veins of right lower extremity with complications 09/98/3382  . Varicose veins of leg with complications 50/53/9767    Orientation RESPIRATION BLADDER Height & Weight     Self, Time, Situation, Place  Normal Continent Weight: 181 lb (82.101 kg) Height:     BEHAVIORAL SYMPTOMS/MOOD NEUROLOGICAL BOWEL NUTRITION STATUS      Continent Diet (Regular Diet)  AMBULATORY STATUS COMMUNICATION OF NEEDS Skin   Limited Assist Verbally Surgical wounds                       Personal Care Assistance Level of Assistance  Bathing, Dressing Bathing Assistance: Limited assistance   Dressing Assistance: Limited assistance     Functional Limitations Info  Hearing   Hearing Info: Impaired      SPECIAL CARE FACTORS FREQUENCY  PT (By licensed PT), OT (By licensed OT)     PT Frequency: 5x a week OT Frequency: 5x a week            Contractures      Additional Factors Info  Allergies, Code Status Code Status Info: Full Allergies Info: PENICILLINS           Current Medications (05/05/2015):  This is the current hospital active medication list Current  Facility-Administered Medications  Medication Dose Route Frequency Provider Last Rate Last Dose  . acetaminophen (TYLENOL) tablet 650 mg  650 mg Oral Q6H PRN Olivia Mackie Shuford, PA-C       Or  . acetaminophen (TYLENOL) suppository 650 mg  650 mg Rectal Q6H PRN Tracy Shuford, PA-C      . albuterol (PROVENTIL) (2.5 MG/3ML) 0.083% nebulizer solution 2.5 mg  2.5 mg Nebulization Q4H PRN Justice Britain, MD      . ALPRAZolam Duanne Moron) tablet 0.25 mg  0.25 mg Oral QHS PRN Jenetta Loges, PA-C      . alum & mag hydroxide-simeth (MAALOX/MYLANTA) 200-200-20 MG/5ML suspension 30 mL  30 mL Oral Q4H PRN Tracy Shuford, PA-C      . amLODipine (NORVASC) tablet 5 mg  5 mg Oral Daily Tracy Shuford, PA-C   5 mg at 05/05/15 0844  . bisacodyl (DULCOLAX) EC tablet 5 mg  5 mg Oral Daily PRN Jenetta Loges, PA-C   5 mg at 05/05/15 0844  . docusate sodium (COLACE) capsule 100 mg  100 mg Oral BID Jenetta Loges, PA-C   100 mg at 05/05/15 0844  . irbesartan (AVAPRO) tablet 150 mg  150 mg Oral Daily Justice Britain, MD   150 mg at 05/05/15 0844   And  . hydrochlorothiazide (MICROZIDE) capsule 12.5 mg  12.5 mg Oral Daily Justice Britain, MD   12.5 mg at 05/05/15 0843  .  HYDROmorphone (DILAUDID) injection 1 mg  1 mg Intravenous Q2H PRN Jenetta Loges, PA-C   1 mg at 05/04/15 2252  . labetalol (NORMODYNE) tablet 200 mg  200 mg Oral BID Jenetta Loges, PA-C   200 mg at 05/05/15 0844  . lactated ringers infusion   Intravenous Continuous Myrtie Soman, MD 10 mL/hr at 05/04/15 [id number]    . lactated ringers infusion   Intravenous Continuous Jenetta Loges, PA-C 75 mL/hr at 05/05/15 0102    . magnesium citrate solution 1 Bottle  1 Bottle Oral Once PRN Jenetta Loges, PA-C      . menthol-cetylpyridinium (CEPACOL) lozenge 3 mg  1 lozenge Oral PRN Olivia Mackie Shuford, PA-C       Or  . phenol (CHLORASEPTIC) mouth spray 1 spray  1 spray Mouth/Throat PRN Tracy Shuford, PA-C      . methocarbamol (ROBAXIN) tablet 500 mg  500 mg Oral Q6H PRN Jenetta Loges, PA-C   500 mg at  05/05/15 1701   Or  . methocarbamol (ROBAXIN) 500 mg in dextrose 5 % 50 mL IVPB  500 mg Intravenous Q6H PRN Tracy Shuford, PA-C      . metoCLOPramide (REGLAN) tablet 5-10 mg  5-10 mg Oral Q8H PRN Tracy Shuford, PA-C       Or  . metoCLOPramide (REGLAN) injection 5-10 mg  5-10 mg Intravenous Q8H PRN Tracy Shuford, PA-C      . ondansetron (ZOFRAN) tablet 4 mg  4 mg Oral Q6H PRN Tracy Shuford, PA-C       Or  . ondansetron (ZOFRAN) injection 4 mg  4 mg Intravenous Q6H PRN Tracy Shuford, PA-C      . oxyCODONE (Oxy IR/ROXICODONE) immediate release tablet 5-10 mg  5-10 mg Oral Q3H PRN Tracy Shuford, PA-C   10 mg at 05/05/15 1701  . polyethylene glycol (MIRALAX / GLYCOLAX) packet 17 g  17 g Oral Daily PRN Jenetta Loges, PA-C      . pravastatin (PRAVACHOL) tablet 40 mg  40 mg Oral q1800 Tracy Shuford, PA-C   40 mg at 05/04/15 1843     Discharge Medications: Please see discharge summary for a list of discharge medications.  Relevant Imaging Results:  Relevant Lab Results:   Additional Information SSN [id number]  Ross Ludwig, [place]

## 2015-05-05 NOTE — Progress Notes (Signed)
Occupational Therapy Evaluation/Treatment Patient Details Name: YURIEL LOPEZMARTINEZ MRN: [id number] DOB: [birth year] Today's Date: 05/05/2015    History of Present Illness L reverse total shoulder arthroplasty   Clinical Impression   PTA, pt lived alone and was independent with ADL and IADL. Pt reports history of falls due to "leg numbness". Pt with limited caregiver support after D/C home. At this time, feel safest D/C plan is for pt to participate in short term rehab at SNF to facilitate safe D/C home. Pt currently requires min to mod A with ADL and minA with mobility and requires mod vc to not pull, push or lift items with his LUE (repeatedly). Pt became tearful during session talking about his situation. Will follow acutely to address established goals and facilitate safe D/C to next venue of care.     Follow Up Recommendations  SNF;Supervision/Assistance - 24 hour (pt has been to [facility])   Equipment Recommendations  None recommended by OT    Recommendations for Other Services       Precautions / Restrictions Precautions Precautions: Fall Precaution Comments: active protocal; FF 0-90; abd 0-60; no resisted IR Required Braces or Orthoses: Sling Restrictions Weight Bearing Restrictions: Yes LUE Weight Bearing: Non weight bearing      Mobility Bed Mobility Overal bed mobility: Needs Assistance Bed Mobility: Supine to Sit;Sit to Supine     Supine to sit: Supervision Sit to supine: Supervision      Transfers Overall transfer level: Needs assistance   Transfers: Sit to/from Stand Sit to Stand: Min guard         General transfer comment: LOB initially upon standing, reaching for table for support; LOB again while walking in hallway. Pt reports feeling unsteady due to being in bed. States he depends heavily on BUE for mobility to "catch himself" when needed. States he "tripped over his puppy and fell on the carpet last week"    Balance Overall balance  assessment: Needs assistance;History of Falls   Sitting balance-Leahy Scale: Good       Standing balance-Leahy Scale: Fair                              ADL Overall ADL's : Needs assistance/impaired     Grooming: Minimal assistance;Sitting   Upper Body Bathing: Minimal assitance;Sitting   Lower Body Bathing: Minimal assistance;Sit to/from stand   Upper Body Dressing : Moderate assistance;Sitting   Lower Body Dressing: Minimal assistance;Sit to/from stand   Toilet Transfer: Min guard;Ambulation   Toileting- Clothing Manipulation and Hygiene: Minimal assistance       Functional mobility during ADLs: Minimal assistance;Cueing for safety General ADL Comments: Pt attempting to pull up pants/reach for items; pull up LLE using LUE during ADL. states "I keep forgetting that I'm not suppose to use it like that"     Vision     Perception     Praxis      Pertinent Vitals/Pain Pain Assessment: 0-10 Pain Score: 3  Pain Location: L shoulder Pain Descriptors / Indicators: Aching Pain Intervention(s): Limited activity within patient's tolerance;Repositioned;Ice applied     Hand Dominance Right   Extremity/Trunk Assessment Upper Extremity Assessment Upper Extremity Assessment: LUE deficits/detail LUE Deficits / Details: s/p reverse shoulder   Lower Extremity Assessment Lower Extremity Assessment: RLE deficits/detail;LLE deficits/detail RLE Sensation: decreased light touch LLE Sensation: decreased light touch   Cervical / Trunk Assessment Cervical / Trunk Assessment: Normal;Other exceptions (hx of back surgery)  Communication Communication Communication: HOH (wears hearing aid)   Cognition Arousal/Alertness: Awake/alert Behavior During Therapy: WFL for tasks assessed/performed Overall Cognitive Status: Within Functional Limits for tasks assessed                     General Comments       Exercises Exercises: Shoulder     Shoulder  Instructions Shoulder Instructions Donning/doffing shirt without moving shoulder: Moderate assistance Method for sponge bathing under operated UE: Minimal assistance Donning/doffing sling/immobilizer: Moderate assistance Correct positioning of sling/immobilizer: Minimal assistance ROM for elbow, wrist and digits of operated UE: Minimal assistance Sling wearing schedule (on at all times/off for ADL's): Minimal assistance Proper positioning of operated UE when showering: Minimal assistance Positioning of UE while sleeping: Minimal assistance    Home Living Family/patient expects to be discharged to:: Private residence Living Arrangements: Alone Available Help at Discharge: Family;Available PRN/intermittently Type of Home: House Home Access: Stairs to enter Entrance Stairs-Number of Steps: 3-4 Entrance Stairs-Rails: Right Home Layout: Able to live on main level with bedroom/bathroom;Two level     Bathroom Shower/Tub: Occupational psychologist: Standard Bathroom Accessibility: Yes How Accessible: Accessible via walker Home Equipment: [place] - single point;Shower seat          Prior Functioning/Environment Level of Independence: Independent        Comments: uses cane occasioinally    OT Diagnosis: Generalized weakness;Acute pain   OT Problem List: Decreased strength;Decreased range of motion;Decreased activity tolerance;Impaired balance (sitting and/or standing);Decreased coordination;Decreased safety awareness;Decreased knowledge of use of DME or AE;Decreased knowledge of precautions;Impaired sensation;Pain;Impaired UE functional use   OT Treatment/Interventions: Self-care/ADL training;Therapeutic exercise;DME and/or AE instruction;Therapeutic activities;Patient/family education;Balance training    OT Goals(Current goals can be found in the care plan section) Acute Rehab OT Goals Patient Stated Goal: to return to being independent OT Goal Formulation: With  patient Time For Goal Achievement: 05/19/15 Potential to Achieve Goals: Good ADL Goals Pt Will Perform Grooming: with supervision;standing Pt Will Perform Upper Body Bathing: with supervision;sitting Pt Will Perform Lower Body Bathing: with supervision;sit to/from stand Pt Will Perform Upper Body Dressing: with supervision;sitting Pt Will Perform Lower Body Dressing: with supervision;sit to/from stand Pt/caregiver will Perform Home Exercise Program: Left upper extremity;With written HEP provided;Independently (FF 0-90; Abd 0-60; No IR; see orders) Additional ADL Goal #1: Pt will donn/doff sling @ mod I level  OT Frequency: Min 3X/week   Barriers to D/C: Decreased caregiver support          Co-evaluation              End of Session Equipment Utilized During Treatment: Gait belt Nurse Communication: Mobility status;Other (comment);Weight bearing status;Precautions (D/C plan)  Activity Tolerance: Patient tolerated treatment well Patient left: in bed;with call bell/phone within reach;with bed alarm set   Time: 6767-2094; 1020-1050 OT Time Calculation (min): 55 min Charges:  OT General Charges $OT Visit: 2 Procedures OT Evaluation $OT Eval Moderate Complexity: 1 Procedure OT Treatments $Self Care/Home Management : 38-52 mins G-Codes:    Aavya Shafer,H[place] 05/12/2015, 11:21 AM   Maurie Boettcher, OTR/L  [phone] 05-12-15

## 2015-05-05 NOTE — Care Management Note (Signed)
Case Management Note  Patient Details  Name: ZAEDYN COVIN MRN: [id number] Date of Birth: 1933-04-15  Subjective/Objective:        S/p left reverse total shoulder arthropasty            Action/Plan: PT recommended SNF. Referral made to CSW. Will continue to follow.  Expected Discharge Date:                  Expected Discharge Plan:  Skilled Nursing Facility  In-House Referral:  Clinical Social Work  Discharge planning Services  CM Consult  Post Acute Care Choice:    Choice offered to:     DME Arranged:    DME Agency:     HH Arranged:    [place] Agency:     Status of Service:  In process, will continue to follow  Medicare Important Message Given:    Date Medicare IM Given:    Medicare IM give by:    Date Additional Medicare IM Given:    Additional Medicare Important Message give by:     If discussed at [place] of Stay Meetings, dates discussed:    Additional Comments:  Nila Nephew, RN 05/05/2015, 1:07 PM

## 2015-05-05 NOTE — Discharge Summary (Addendum)
PATIENT ID:      [name]  MRN:     [id number] DOB/AGE:    [birth year] / 82 y.o.     DISCHARGE SUMMARY  ADMISSION DATE:    05/04/2015 DISCHARGE DATE:    ADMISSION DIAGNOSIS: left shoulder rotator cuff arthropathy Past Medical History  Diagnosis Date  . Hypertension   . Varicose veins   . Hyperlipidemia   . Cancer [facility])     prostate  . Depression   . Anxiety   . Urinary frequency   . Arthritis     DISCHARGE DIAGNOSIS:   Active Problems:   S/p reverse total shoulder arthroplasty   PROCEDURE: Procedure(s): LEFT REVERSE SHOULDER ARTHROPLASTY on 05/04/2015  CONSULTS:     HISTORY:  See H&P in chart.  HOSPITAL COURSE:  [name] is a 82 y.o. admitted on 05/04/2015 with a diagnosis of left shoulder rotator cuff arthropathy.  They were brought to the operating room on 05/04/2015 and underwent Procedure(s): LEFT REVERSE SHOULDER ARTHROPLASTY.    They were given perioperative antibiotics: Anti-infectives    Start     Dose/Rate Route Frequency Ordered Stop   05/04/15 1600  ceFAZolin (ANCEF) IVPB 2g/100 mL premix     2 g 200 mL/hr over 30 Minutes Intravenous Every 6 hours 05/04/15 1302 05/05/15 0511   05/04/15 0652  ceFAZolin (ANCEF) IVPB 2g/100 mL premix  Status:  Discontinued     2 g 200 mL/hr over 30 Minutes Intravenous On call to O.R. 05/04/15 0653 05/04/15 1246   05/04/15 0647  ceFAZolin (ANCEF) 2-4 GM/100ML-% IVPB    Comments:  Sammuel Cooper   : cabinet override      05/04/15 0647 05/04/15 0942    .  Patient underwent the above named procedure and tolerated it well. The following day they were hemodynamically stable and pain was controlled on oral analgesics. They were neurovascularly intact to the operative extremity. OT was ordered and worked with patient per protocol. They were medically and orthopaedically stable for discharge but disposition plans pending.  The family and therapists were helping to gauge his safety and home abilities to decide between  short term skilled and home with home health services  DIAGNOSTIC STUDIES:  RECENT RADIOGRAPHIC STUDIES :  No results found.  RECENT VITAL SIGNS:  Patient Vitals for the past 24 hrs:  BP Temp Temp src Pulse Resp SpO2  05/05/15 0444 127/67 mmHg 98.3 F (36.8 C) - 97 20 93 %  05/04/15 1935 115/64 mmHg 97.9 F (36.6 C) Oral (!) 103 20 98 %  05/04/15 1547 (!) 105/53 mmHg 97.4 F (36.3 C) - 86 16 99 %  05/04/15 1307 (!) 96/50 mmHg 97.6 F (36.4 C) - 66 16 99 %  05/04/15 1230 96/62 mmHg - - 65 13 100 %  05/04/15 1215 (!) 96/58 mmHg - - 65 10 100 %  05/04/15 1200 (!) 93/51 mmHg - - 67 14 100 %  05/04/15 1145 (!) 90/55 mmHg - - 67 14 98 %  05/04/15 1130 (!) 94/52 mmHg - - 71 13 99 %  05/04/15 1115 (!) 98/54 mmHg 97.6 F (36.4 C) - 75 14 98 %  05/04/15 0850 (!) 118/58 mmHg - - 71 13 98 %  05/04/15 0845 (!) 105/58 mmHg - - 73 13 98 %  05/04/15 0840 (!) 117/59 mmHg - - 70 13 98 %  .  RECENT EKG RESULTS:    Orders placed or performed during the hospital encounter of 05/03/15  . EKG 12  lead  . EKG 12 lead    DISCHARGE INSTRUCTIONS:  Discharge Instructions    Discontinue IV    Complete by:  As directed            DISCHARGE MEDICATIONS:     Medication List    TAKE these medications        albuterol 108 (90 Base) MCG/ACT inhaler  Commonly known as:  PROVENTIL HFA;VENTOLIN HFA  Inhale 1-2 puffs into the lungs every 4 (four) hours as needed for wheezing.     ALPRAZolam 0.25 MG tablet  Commonly known as:  XANAX  Take 0.25 mg by mouth at bedtime as needed for anxiety.     amLODipine 5 MG tablet  Commonly known as:  NORVASC  Take 5 mg by mouth daily.     celecoxib 200 MG capsule  Commonly known as:  CELEBREX  Take 200 mg by mouth daily.     fenofibrate micronized 200 MG capsule  Commonly known as:  LOFIBRA  Take 200 mg by mouth daily before breakfast.     FISH OIL + D3 1000-1000 MG-UNIT Caps  Take 1 capsule by mouth daily.     labetalol 200 MG tablet  Commonly  known as:  NORMODYNE  Take 200 mg by mouth 2 (two) times daily.     MULTIPLE VITAMINS PO  Take 1 tablet by mouth daily.     olmesartan-hydrochlorothiazide 20-12.5 MG tablet  Commonly known as:  BENICAR HCT  Take 1 tablet by mouth daily.     oxyCODONE-acetaminophen 5-325 MG tablet  Commonly known as:  PERCOCET/ROXICET  Take 1 tablet by mouth at bedtime.     polyethylene glycol packet  Commonly known as:  MIRALAX / GLYCOLAX  Take 17 g by mouth daily as needed for mild constipation.     pravastatin 40 MG tablet  Commonly known as:  PRAVACHOL  Take 40 mg by mouth daily.     VITAMIN D-3 PO  Take 1 tablet by mouth daily.        FOLLOW UP VISIT:       Follow-up Information    Follow up with Metta Clines SUPPLE, MD.   Specialty:  Orthopedic Surgery   Why:  call to be seen in 10-14 days   Contact information:   [address] [place] [address] [phone]       DISCHARGE TO: pending  DISPOSITION: good  DISCHARGE CONDITION:  Festus Barren for Dr. Justice Britain 05/05/2015, 8:12 AM   Patient remained stable through the weekend and was ready for discharge this am to skilled  Jenetta Loges, PA-C

## 2015-05-05 NOTE — Clinical Social Work Note (Signed)
CSW met with patient and he would like to go to SNF in [place], [place] faxed patient's information to [facility]. CSW to continue to follow patient's progress awaiting bed offers from SNFs.  Jones Broom. [place], MSW, [place] 05/05/2015 6:50 PM

## 2015-05-05 NOTE — Clinical Social Work Note (Signed)
Clinical Social Work Assessment  Patient Details  Name: [name] MRN: [id number] Date of Birth: 09-27-1933  Date of referral:  05/05/15               Reason for consult:  Facility Placement                Permission sought to share information with:  Facility Sport and exercise psychologist, Family Supports Permission granted to share information::  Yes, Verbal Permission Granted  Name::     [name] Daughter [phone]  Agency::  SNF admissions  Relationship::     Contact Information:     Housing/Transportation Living arrangements for the past 2 months:  Single Family Home Source of Information:  Patient Patient Interpreter Needed:  None Criminal Activity/Legal Involvement Pertinent to Current Situation/Hospitalization:  No - Comment as needed Significant Relationships:  Adult Children Lives with:  Self Do you feel safe going back to the place where you live?  No (Patient needs some short term rehab before he can return back home.) Need for family participation in patient care:  No (Coment)  Care giving concerns:  Patient lives alone and feel he needs short term rehab before he is able to return back home.   Social Worker assessment / plan: Patient is a 82 year old male who lives alone in a house, patient is alert and oriented x4 and able to express his feelings.  Patient states he has been to rehab at a SNF before, and would like to go to [facility] if possible.  Patient stated last time he was there he was pleased with the care and therapy team.  Patient stated that he is hopeful to get to SNF for therapy so he can get home soon.  Patient was explained how insurance pays for his stay and he stated he was familiar from last time of what to expect.  Patient stated he did not have any more questions or concerns.  Employment status:  Retired Forensic scientist:  Medicare PT Recommendations:  [place] / Referral to community  resources:  [place]  Patient/Family's Response to care: Patient in agreement to going to SNF for short term rehab.  Patient/Family's Understanding of and Emotional Response to Diagnosis, Current Treatment, and Prognosis:  Patient aware of current diagnosis and treatment plan.  Emotional Assessment Appearance:  Appears stated age Attitude/Demeanor/Rapport:    Affect (typically observed):  Appropriate, Calm, Hopeful, Stable Orientation:  Oriented to Self, Oriented to Place, Oriented to  Time, Oriented to Situation Alcohol / Substance use:  Not Applicable Psych involvement (Current and /or in the community):  No (Comment)  Discharge Needs  Concerns to be addressed:  Lack of Support Readmission within the last 30 days:  No Current discharge risk:  Lives alone Barriers to Discharge:  Continued Medical Work up   Anell Barr 05/05/2015, 6:38 PM

## 2015-05-05 NOTE — Evaluation (Signed)
Physical Therapy Evaluation Patient Details Name: [name] MRN: [id number] DOB: 1933-04-12 Today's Date: 05/05/2015   History of Present Illness  L reverse total shoulder arthroplasty  Clinical Impression  Patient is s/p above surgery resulting in functional limitations due to the deficits listed below (see PT Problem List). Pt describes that he does live alone and he does not have any assistance available during the day. Functionally the patient was able to ambulate 300 ft without an assistive device but he was frequently reaching for objects in the room and in halls for additional support. Due to the patient's instability with ambulation, recommending short stay at SNF prior to returning home alone. Will continue to follow acutely to progress mobility and safety.        Follow Up Recommendations SNF;Supervision for mobility/OOB    Equipment Recommendations  None recommended by PT    Recommendations for Other Services       Precautions / Restrictions Precautions Precautions: Fall Precaution Comments: active protocal; FF 0-90; abd 0-60; no resisted IR Required Braces or Orthoses: Sling Restrictions Weight Bearing Restrictions: Yes LUE Weight Bearing: Non weight bearing      Mobility  Bed Mobility Overal bed mobility: Needs Assistance Bed Mobility: Supine to Sit     Supine to sit: Supervision Sit to supine: Supervision   General bed mobility comments: bed flat no rails. Repeating x2, once with rail and once without.   Transfers Overall transfer level: Needs assistance Equipment used: None Transfers: Sit to/from Stand Sit to Stand: Min guard         General transfer comment: mild instability with standing but no gross loss of balance.   Ambulation/Gait Ambulation/Gait assistance: Min guard Ambulation Distance (Feet): 300 Feet Assistive device: None Gait Pattern/deviations: Step-through pattern Gait velocity: decreased   General Gait Details: Unsteady  pattern with pt reaching for objects in room and rail in hallway. Stability did improve as session progressed. Pt declined use of SPC.   Stairs Stairs: Yes Stairs assistance: Min guard Stair Management: One rail Right;Alternating pattern Number of Stairs: 5    Wheelchair Mobility    Modified Rankin (Stroke Patients Only)       Balance Overall balance assessment: Needs assistance Sitting-balance support: No upper extremity supported Sitting balance-Leahy Scale: Good     Standing balance support: No upper extremity supported Standing balance-Leahy Scale: Fair Standing balance comment: Pt reaching for objects for stability during ambulation. Unsteay pattern but no gross loss of balance.                              Pertinent Vitals/Pain Pain Assessment: Faces Pain Score: 3  Faces Pain Scale: Hurts a little bit Pain Location: Lt shoulder Pain Descriptors / Indicators: Guarding Pain Intervention(s): Limited activity within patient's tolerance;Monitored during session    Home Living Family/patient expects to be discharged to:: Unsure Living Arrangements: Alone Available Help at Discharge: Family;Available PRN/intermittently Type of Home: House Home Access: Stairs to enter Entrance Stairs-Rails: Right Entrance Stairs-Number of Steps: 3-4 Home Layout: Two level;Able to live on main level with bedroom/bathroom Home Equipment: Kasandra Knudsen - single point Additional Comments: Pt reports that his daughters work and that they would not be aroung to help him during the day.     Prior Function Level of Independence: Independent         Comments: reports the he was not cooking but driving to resturants for all meals.  Hand Dominance   Dominant Hand: Right    Extremity/Trunk Assessment   Upper Extremity Assessment: Defer to OT evaluation       LUE Deficits / Details: s/p reverse shoulder   Lower Extremity Assessment: Generalized weakness      Cervical /  Trunk Assessment: Normal;Other exceptions (hx of back surgery)  Communication   Communication: HOH  Cognition Arousal/Alertness: Awake/alert Behavior During Therapy: WFL for tasks assessed/performed Overall Cognitive Status: Within Functional Limits for tasks assessed                      General Comments General comments (skin integrity, edema, etc.): Performing multiple transfers during session including from bed and chair height. Pt bending over and looking for hearing aid in chair with noted instabilty.     Exercises Shoulder Exercises Shoulder Flexion: Left;PROM;AAROM;10 reps;Supine (0-90) Shoulder ABduction: PROM;AAROM;Left;10 reps;Supine (0-60) Shoulder External Rotation: PROM;AAROM;10 reps;Supine (to neutral; no resisted IR) Elbow Flexion: AROM;Left;10 reps;Supine Elbow Extension: AROM;Left;10 reps;Supine Wrist Flexion: AROM;Left;10 reps Wrist Extension: AROM;Left;10 reps Digit Composite Flexion: AROM;Left;10 reps Composite Extension: AROM;Left;10 reps Donning/doffing shirt without moving shoulder: Moderate assistance Method for sponge bathing under operated UE: Minimal assistance Donning/doffing sling/immobilizer: Moderate assistance Correct positioning of sling/immobilizer: Minimal assistance ROM for elbow, wrist and digits of operated UE: Minimal assistance Sling wearing schedule (on at all times/off for ADL's): Minimal assistance Proper positioning of operated UE when showering: Minimal assistance Positioning of UE while sleeping: Minimal assistance      Assessment/Plan    PT Assessment Patient needs continued PT services  PT Diagnosis Difficulty walking;Generalized weakness   PT Problem List Decreased strength;Decreased activity tolerance;Decreased balance;Decreased mobility  PT Treatment Interventions DME instruction;Gait training;Stair training;Functional mobility training;Therapeutic activities;Therapeutic exercise;Balance training;Patient/family  education   PT Goals (Current goals can be found in the Care Plan section) Acute Rehab PT Goals Patient Stated Goal: get more rehab before going home.  PT Goal Formulation: With patient Time For Goal Achievement: 05/19/15 Potential to Achieve Goals: Good    Frequency Min 3X/week   Barriers to discharge Decreased caregiver support      Co-evaluation               End of Session Equipment Utilized During Treatment: Other (comment) (sling) Activity Tolerance: Patient tolerated treatment well Patient left: in chair;with call bell/phone within reach;with chair alarm set Nurse Communication: Mobility status    Functional Assessment Tool Used: clinical judgment Functional Limitation: Mobility: Walking and moving around Mobility: Walking and Moving Around Current Status (T6144): At least 20 percent but less than 40 percent impaired, limited or restricted Mobility: Walking and Moving Around Goal Status [id number]): At least 1 percent but less than 20 percent impaired, limited or restricted    Time: 1152-1234 PT Time Calculation (min) (ACUTE ONLY): 42 min   Charges:   PT Evaluation $PT Eval Moderate Complexity: 1 Procedure PT Treatments $Gait Training: 8-22 mins $Therapeutic Activity: 8-22 mins   PT G Codes:   PT G-Codes **NOT FOR INPATIENT CLASS** Functional Assessment Tool Used: clinical judgment Functional Limitation: Mobility: Walking and moving around Mobility: Walking and Moving Around Current Status (G8676): At least 20 percent but less than 40 percent impaired, limited or restricted Mobility: Walking and Moving Around Goal Status [phone]): At least 1 percent but less than 20 percent impaired, limited or restricted    Cassell Clement, PT, [place] Pager [phone] Office [phone]  05/05/2015, 1:08 PM

## 2015-05-05 NOTE — Clinical Social Work Placement (Signed)
   CLINICAL SOCIAL WORK PLACEMENT  NOTE  Date:  05/05/2015  Patient Details  Name: [name] MRN: [id number] Date of Birth: [birth year]  Clinical Social Work is seeking post-discharge placement for this patient at the [place] level of care (*CSW will initial, date and re-position this form in  chart as items are completed):  Yes   Patient/family provided with [place] Work Department's list of facilities offering this level of care within the geographic area requested by the patient (or if unable, by the patient's family).  Yes   Patient/family informed of their freedom to choose among providers that offer the needed level of care, that participate in Medicare, Medicaid or managed care program needed by the patient, have an available bed and are willing to accept the patient.  Yes   Patient/family informed of 's ownership interest in [facility] and [facility], as well as of the fact that they are under no obligation to receive care at these facilities.  PASRR submitted to EDS on 05/05/15     PASRR number received on       Existing PASRR number confirmed on 05/05/15     FL2 transmitted to all facilities in geographic area requested by pt/family on 05/05/15     FL2 transmitted to all facilities within larger geographic area on       Patient informed that his/her managed care company has contracts with or will negotiate with certain facilities, including the following:            Patient/family informed of bed offers received.  Patient chooses bed at       Physician recommends and patient chooses bed at      Patient to be transferred to   on  .  Patient to be transferred to facility by       Patient family notified on   of transfer.  Name of family member notified:        PHYSICIAN Please sign FL2     Additional Comment:    _______________________________________________ Ross Ludwig, LCSWA 05/05/2015,  6:47 PM

## 2015-05-06 NOTE — Progress Notes (Addendum)
Occupational Therapy Treatment Patient Details Name: [name] MRN: [id number] DOB: Oct 23, 1933 Today's Date: 05/06/2015    History of present illness L reverse total shoulder arthroplasty   OT comments  Pt progressing. Pt did not remember any exercises from yesterday's session. Completed exercises per protocal. Pt able to achieve @ 0-70 FF; 0-45 Abd and ER to neutral. Continues to require cues to not pull/push with LUE when not in sling.Continue to recommend SNF for short term rehab to facilitate safe return home. Will continue to follow acutely to address established goals.  Follow Up Recommendations  SNF;Supervision/Assistance - 24 hour    Equipment Recommendations  None recommended by OT    Recommendations for Other Services      Precautions / Restrictions Precautions Precautions: Fall Precaution Comments: active protocal; FF 0-90; abd 0-60; no resisted IR Required Braces or Orthoses: Sling Restrictions Weight Bearing Restrictions: Yes LUE Weight Bearing: Non weight bearing       Mobility Bed Mobility               General bed mobility comments: up in chair  Transfers Overall transfer level: Needs assistance Equipment used: None Transfers: Sit to/from Stand Sit to Stand: Min guard         General transfer comment: mild instability with standing but no gross loss of balance.     Balance Overall balance assessment: Needs assistance Sitting-balance support: No upper extremity supported Sitting balance-Leahy Scale: Good     Standing balance support: No upper extremity supported Standing balance-Leahy Scale: Fair                     ADL Overall ADL's : Needs assistance/impaired                   Upper Body Dressing Details (indicate cue type and reason): mod A to donn sling Lower Body Dressing: Minimal assistance;Sit to/from stand               Functional mobility during ADLs: Min guard;Cueing for safety Ambulated with min  guard assist. Unsteady gait.        Vision                     Perception     Praxis      Cognition   Behavior During Therapy: [facility] for tasks assessed/performed Overall Cognitive Status: Within Functional Limits for tasks assessed       Memory: Decreased short-term memory               Extremity/Trunk Assessment               Exercises Shoulder Exercises Shoulder Flexion: Left;PROM;AAROM;10 reps;Supine Shoulder ABduction: PROM;AAROM;Left;10 reps;Supine Shoulder External Rotation: PROM;AAROM;10 reps;Supine Elbow Flexion: AROM;Left;10 reps;Supine Elbow Extension: AROM;Left;10 reps;Supine Wrist Flexion: AROM;Left;10 reps Wrist Extension: AROM;Left;10 reps Digit Composite Flexion: AROM;Left;10 reps Composite Extension: AROM;Left;10 reps Donning/doffing sling/immobilizer: Moderate assistance Positioning of UE while sleeping: Minimal assistance   Shoulder Instructions Shoulder Instructions Donning/doffing sling/immobilizer: Moderate assistance Positioning of UE while sleeping: Minimal assistance     General Comments      Pertinent Vitals/ Pain       Pain Assessment: Faces Faces Pain Scale: Hurts a little bit Pain Location: L shoulder with ROM Pain Descriptors / Indicators: Grimacing Pain Intervention(s): Limited activity within patient's tolerance;Repositioned;Ice applied  Home Living  Prior Functioning/Environment              Frequency Min 3X/week     Progress Toward Goals  OT Goals(current goals can now be found in the care plan section)  Progress towards OT goals: Progressing toward goals  Acute Rehab OT Goals Patient Stated Goal: get more rehab before going home.  OT Goal Formulation: With patient Time For Goal Achievement: 05/19/15 Potential to Achieve Goals: Good ADL Goals Pt Will Perform Grooming: with supervision;standing Pt Will Perform Upper Body Bathing: with  supervision;sitting Pt Will Perform Lower Body Bathing: with supervision;sit to/from stand Pt Will Perform Upper Body Dressing: with supervision;sitting Pt Will Perform Lower Body Dressing: with supervision;sit to/from stand Pt/caregiver will Perform Home Exercise Program: Left upper extremity;With written HEP provided;Independently Additional ADL Goal #1: Pt will donn/doff sling @ mod I level  Plan Discharge plan remains appropriate    Co-evaluation                 End of Session Equipment Utilized During Treatment: Gait belt   Activity Tolerance Patient tolerated treatment well   Patient Left in chair;with call bell/phone within reach;with chair alarm set;with family/visitor present   Nurse Communication Mobility status        Time: 1411-1438 OT Time Calculation (min): 27 min  Charges: OT General Charges $OT Visit: 1 Procedure OT Treatments $Therapeutic Activity: 8-22 mins $Therapeutic Exercise: 8-22 mins  Zylan Almquist,HILLARY 05/06/2015, 4:00 PM   [facility], OTR/L  [phone] 05/06/2015

## 2015-05-06 NOTE — Progress Notes (Signed)
Physical Therapy Treatment Patient Details Name: [name] MRN: [id number] DOB: [birth year] Today's Date: 05/06/2015    History of Present Illness L reverse total shoulder arthroplasty    PT Comments    Pt progressing with ambulation and transfers regarding stability and independence. Pt continues with an unsteady gait pattern but no gross loss of balance. Will continue to follow and advance as tolerated.   Follow Up Recommendations  SNF;Supervision for mobility/OOB     Equipment Recommendations  None recommended by PT    Recommendations for Other Services       Precautions / Restrictions Precautions Precautions: Fall Precaution Comments: active protocal; FF 0-90; abd 0-60; no resisted IR Required Braces or Orthoses: Sling Restrictions Weight Bearing Restrictions: Yes LUE Weight Bearing: Non weight bearing    Mobility  Bed Mobility               General bed mobility comments: up in chair upon arrival  Transfers Overall transfer level: Needs assistance Equipment used: None Transfers: Sit to/from Stand Sit to Stand: Min guard         General transfer comment: mild instability with standing but no gross loss of balance.   Ambulation/Gait Ambulation/Gait assistance: Min guard Ambulation Distance (Feet): 275 Feet Assistive device: None Gait Pattern/deviations: Step-through pattern Gait velocity: decreased   General Gait Details: Minimal knee flexion with swing phase and general unsteady pattern but no gross loss of balance.    Stairs            Wheelchair Mobility    Modified Rankin (Stroke Patients Only)       Balance Overall balance assessment: Needs assistance Sitting-balance support: No upper extremity supported Sitting balance-Leahy Scale: Good     Standing balance support: No upper extremity supported Standing balance-Leahy Scale: Fair                      Cognition Arousal/Alertness: Awake/alert Behavior During  Therapy: WFL for tasks assessed/performed Overall Cognitive Status: Within Functional Limits for tasks assessed                      Exercises      General Comments        Pertinent Vitals/Pain Pain Assessment: Faces Faces Pain Scale: Hurts a little bit Pain Location: Lt shoulder Pain Descriptors / Indicators: Guarding Pain Intervention(s): Monitored during session    Home Living                      Prior Function            PT Goals (current goals can now be found in the care plan section) Acute Rehab PT Goals Patient Stated Goal: get more rehab before going home.  PT Goal Formulation: With patient Time For Goal Achievement: 05/19/15 Potential to Achieve Goals: Good Progress towards PT goals: Progressing toward goals    Frequency  Min 2X/week    PT Plan Frequency needs to be updated    Co-evaluation             End of Session Equipment Utilized During Treatment: Gait belt Activity Tolerance: Patient tolerated treatment well Patient left: in chair;with call bell/phone within reach;with nursing/sitter in room     Time: 1102-1115 PT Time Calculation (min) (ACUTE ONLY): 13 min  Charges:  $Gait Training: 8-22 mins                    G Codes:  Cassell Clement, PT, CSCS Pager [phone] Office [phone]  05/06/2015, 2:02 PM

## 2015-05-06 NOTE — Progress Notes (Signed)
   Subjective:  Patient reports pain as mild to moderate.  Denies N/V/CP/SOB.  Objective:   VITALS:   Filed Vitals:   05/05/15 0444 05/05/15 1500 05/05/15 2050 05/06/15 0343  BP: 127/67 135/73 141/73 132/68  Pulse: 97 96 92 95  Temp: 98.3 F (36.8 C) 99.1 F (37.3 C) 98.2 F (36.8 C) 98.1 F (36.7 C)  TempSrc:  Oral Oral   Resp: '20 18 18 18  '$ Weight:      SpO2: 93% 94% 95% 98%    ABD soft Incision: dressing C/D/I Sling intact (+) AIN, PIN, U 2+ radial SILT  Lab Results  Component Value Date   WBC 5.8 05/03/2015   HGB 13.8 05/03/2015   HCT 40.2 05/03/2015   MCV 96.4 05/03/2015   PLT 125* 05/03/2015   BMET    Component Value Date/Time   NA 139 05/03/2015 0900   K 3.8 05/03/2015 0900   CL 104 05/03/2015 0900   CO2 26 05/03/2015 0900   GLUCOSE 115* 05/03/2015 0900   BUN 22* 05/03/2015 0900   CREATININE 1.32* 05/03/2015 0900   CALCIUM 9.9 05/03/2015 0900   GFRNONAA 50* 05/03/2015 0900   GFRAA 58* 05/03/2015 0900     Assessment/Plan: 2 Days Post-Op   Active Problems:   S/p reverse total shoulder arthroplasty   NWB LUE PO pain control OT SNF placement    Bralen Wiltgen, Horald Pollen 05/06/2015, 7:17 AM   Rod Can, MD Cell [phone]

## 2015-05-07 NOTE — Progress Notes (Signed)
  Pt put hearing aid in r ear. Tip came off in ear canal and can not be removed. It is not causing the pt any distress and he is agreeable to waiting until MD makes rounds in the morning.

## 2015-05-07 NOTE — Progress Notes (Signed)
Orthopedics Progress Note  Subjective: Patient states that his shoulder is doing fine.  He was concerned because he lost a part to his hearing aid.  I looked in both ears with an otoscope and did no see mall grey knob.  The OT and I checked the floor closely and did not find it.  Objective:  Filed Vitals:   05/06/15 2023 05/07/15 0450  BP: 144/64 134/62  Pulse: 97 89  Temp: 99.2 F (37.3 C) 99.1 F (37.3 C)  Resp: 18 18    General: Awake and alert  Musculoskeletal: left shoulder wound looks great, no erythema no drainage Aqualcell dressing reapplied Neurovascularly intact  Lab Results  Component Value Date   WBC 5.8 05/03/2015   HGB 13.8 05/03/2015   HCT 40.2 05/03/2015   MCV 96.4 05/03/2015   PLT 125* 05/03/2015       Component Value Date/Time   NA 139 05/03/2015 0900   K 3.8 05/03/2015 0900   CL 104 05/03/2015 0900   CO2 26 05/03/2015 0900   GLUCOSE 115* 05/03/2015 0900   BUN 22* 05/03/2015 0900   CREATININE 1.32* 05/03/2015 0900   CALCIUM 9.9 05/03/2015 0900   GFRNONAA 50* 05/03/2015 0900   GFRAA 58* 05/03/2015 0900    Lab Results  Component Value Date   INR 2.17* 11/02/2009   INR 1.51* 11/01/2009   INR 1.13 10/31/2009    Assessment/Plan: POD #3 s/p Procedure(s): LEFT REVERSE SHOULDER ARTHROPLASTY D/C to SNF pending Doing very well otherwise  Remo Lipps R. Veverly Fells, MD 05/07/2015 11:04 AM

## 2015-05-07 NOTE — Progress Notes (Signed)
Occupational Therapy Treatment Patient Details Name: [name] MRN: [id number] DOB: 14-Jun-1933 Today's Date: 05/07/2015    History of present illness L reverse total shoulder arthroplasty   OT comments  Pt. Seen for review and completion of L shoulder exercises and ROM.  Tolerated well and states pain is well managed.  Will continue to follow acutely.    Follow Up Recommendations  SNF;Supervision/Assistance - 24 hour    Equipment Recommendations  None recommended by OT    Recommendations for Other Services      Precautions / Restrictions Precautions Precautions: Fall Precaution Comments: active protocal; FF 0-90; abd 0-60; no resisted IR Required Braces or Orthoses: Sling Restrictions LUE Weight Bearing: Non weight bearing       Mobility Bed Mobility                  Transfers                      Balance                                   ADL                                                Vision                     Perception     Praxis      Cognition                             Extremity/Trunk Assessment               Exercises Shoulder Exercises Shoulder Flexion: Left;PROM;AAROM;10 reps;Seated Shoulder ABduction: PROM;AAROM;Left;10 reps;Seated Shoulder External Rotation: PROM;AAROM;10 reps;Seated Elbow Flexion: AROM;Left;10 reps;Seated Elbow Extension: AROM;Left;10 reps;Seated Wrist Flexion: AROM;Left;10 reps;Seated Wrist Extension: AROM;Left;10 reps Digit Composite Flexion: AROM;Left;10 reps;Seated Composite Extension: AROM;Left;10 reps;Seated   Shoulder Instructions       General Comments      Pertinent Vitals/ Pain          Home Living                                          Prior Functioning/Environment              Frequency Min 3X/week     Progress Toward Goals  OT Goals(current goals can now be found in the care plan  section)  Progress towards OT goals: Progressing toward goals     Plan Discharge plan remains appropriate    Co-evaluation                 End of Session     Activity Tolerance Patient tolerated treatment well   Patient Left in chair;with call bell/phone within reach;with chair alarm set   Nurse Communication          Time: 9983-3825 OT Time Calculation (min): 34 min  Charges: OT General Charges $OT Visit: 1 Procedure OT Treatments $Therapeutic Exercise: 23-37 mins  Janice Coffin, COTA/L 05/07/2015, 11:23 AM

## 2015-05-07 NOTE — Progress Notes (Signed)
PA came to room and removed ear piece lodged in right ear canal.

## 2015-05-07 NOTE — Progress Notes (Signed)
Reassessed pt right ear. The tip of the left ear aid stucked in his right ear canal still. Pt denied pain or other distress, but pt ant an ENT MD to fix it before his discharge.  Notified on call MD. Dr.Norris acknowledged the message and agree to check on pt after he finish surgery.

## 2015-05-08 NOTE — Care Management Important Message (Signed)
Important Message  Patient Details  Name: [name] MRN: [id number] Date of Birth: [birth year]   Medicare Important Message Given:  Yes    Johncarlo Maalouf P Jazmon Kos 05/08/2015, 1:23 PM

## 2015-05-08 NOTE — Progress Notes (Signed)
Patient has been stable through weekend and ok to discharge to Skilled care by way of private vehicle

## 2015-05-08 NOTE — Progress Notes (Signed)
Report called to Rachel-LPN at [facility]. All questions/concerns addressed. Waiting for pt's daughter to transport pt to facility, will continue to monitor

## 2015-05-08 NOTE — Clinical Social Work Note (Signed)
Patient to be d/c'ed today to [facility].  Patient and family agreeable to plans will transport via daughter's car RN to call report to [phone] station 3 room 707.  Evette Cristal, MSW, [place]

## 2015-05-08 NOTE — Progress Notes (Signed)
Occupational Therapy Treatment Patient Details Name: [name] MRN: [id number] DOB: 1934-01-13 Today's Date: 05/08/2015    History of present illness L reverse total shoulder arthroplasty   OT comments  Pt making steady progress. Continues to requires assistance to remember appropriate exercises. Continue to recommend rehab at SNF to facilitate safe return home @ mod I level.   Follow Up Recommendations  SNF;Supervision/Assistance - 24 hour    Equipment Recommendations  None recommended by OT    Recommendations for Other Services      Precautions / Restrictions Precautions Precautions: Fall Precaution Comments: active protocal; FF 0-90; abd 0-60; no resisted IR Required Braces or Orthoses: Sling Restrictions LUE Weight Bearing: Non weight bearing       Mobility Bed Mobility         mod I           Transfers      S                            Cognition   Behavior During Therapy: WFL for tasks assessed/performed Overall Cognitive Status: Within Functional Limits for tasks assessed       Memory: Decreased short-term memory               Extremity/Trunk Assessment   able to tolerate 0-90 AAROM FF ER o- neutral  Abd 0-60 AAROM AROM elbow flex/ext and sup.pron WFL Unable to reproduce all exercises when asked immediately after completing them with therapist.            Exercises Shoulder Exercises Shoulder Flexion: Left;PROM;AAROM;Seated;20 reps Shoulder ABduction: PROM;AAROM;Left Shoulder External Rotation: PROM;AAROM;Left;20 reps Elbow Flexion: AROM;Left;20 reps Elbow Extension: AROM;Left;20 reps Wrist Flexion: AROM;Left;20 reps Wrist Extension: AROM;Left;20 reps Digit Composite Flexion: AROM;Left;20 reps Composite Extension: AROM;Left;15 reps   Shoulder Instructions   Encouraged use of ice for L shoulder    General Comments      Pertinent Vitals/ Pain       Pain Assessment: 0-10 Pain Score: 2  Pain Location: L  shoulder Pain Descriptors / Indicators: Aching Pain Intervention(s): Limited activity within patient's tolerance;Monitored during session;Patient requesting pain meds-RN notified;Repositioned  Home Living                                          Prior Functioning/Environment              Frequency Min 3X/week     Progress Toward Goals  OT Goals(current goals can now be found in the care plan section)  Progress towards OT goals: Progressing toward goals  Acute Rehab OT Goals Patient Stated Goal: get more rehab before going home.  OT Goal Formulation: With patient Time For Goal Achievement: 05/19/15 Potential to Achieve Goals: Good ADL Goals Pt Will Perform Grooming: with supervision;standing Pt Will Perform Upper Body Bathing: with supervision;sitting Pt Will Perform Lower Body Bathing: with supervision;sit to/from stand Pt Will Perform Upper Body Dressing: with supervision;sitting Pt Will Perform Lower Body Dressing: with supervision;sit to/from stand Pt/caregiver will Perform Home Exercise Program: Left upper extremity;With written HEP provided;Independently Additional ADL Goal #1: Pt will donn/doff sling @ mod I level  Plan Discharge plan remains appropriate    Co-evaluation                 End of Session     Activity Tolerance Patient  tolerated treatment well   Patient Left in bed;with call bell/phone within reach;with family/visitor present   Nurse Communication Mobility status;Precautions;Weight bearing status;Patient requests pain meds        Time: 1120-1136 OT Time Calculation (min): 16 min  Charges: OT General Charges $OT Visit: 1 Procedure OT Treatments $Therapeutic Exercise: 8-22 mins  [name],[name] 05/08/2015, 11:43 AM   [name], OTR/L  [phone] 05/08/2015

## 2015-05-17 ENCOUNTER — Encounter (HOSPITAL_COMMUNITY): Payer: Self-pay | Admitting: Orthopedic Surgery

## 2015-05-30 ENCOUNTER — Ambulatory Visit: Payer: Medicare Other | Admitting: Vascular Surgery

## 2015-12-27 ENCOUNTER — Other Ambulatory Visit: Payer: Self-pay | Admitting: Orthopedic Surgery

## 2015-12-27 DIAGNOSIS — G959 Disease of spinal cord, unspecified: Secondary | ICD-10-CM

## 2015-12-28 ENCOUNTER — Other Ambulatory Visit: Payer: Self-pay | Admitting: Orthopedic Surgery

## 2015-12-29 ENCOUNTER — Ambulatory Visit
Admission: RE | Admit: 2015-12-29 | Discharge: 2015-12-29 | Disposition: A | Discharge status: Home / Self Care | Payer: Medicare Other | Source: Ambulatory Visit | Attending: Orthopedic Surgery | Admitting: Orthopedic Surgery

## 2015-12-29 DIAGNOSIS — G959 Disease of spinal cord, unspecified: Secondary | ICD-10-CM

## 2016-01-12 NOTE — Pre-Procedure Instructions (Signed)
[name]  01/12/2016      [id number] [place], [place] - [address] [address]Phone: [phone] Fax: [phone]   Your procedure is scheduled on Wed, Jan 10 @ 12:45 PM  [facility]  Call this number if you have problems the morning of surgery:  [phone]   Remember:  Do not eat food or drink liquids after midnight.  Take these medicines the morning of surgery with A SIP OF WATER Albuterol<Bring Your Inhaler With You>,Labetalol(Normodyne),Requip(Ropinirole),and Bactrim(Sulfamethoxazole)              Stop taking your Fish Oil along with any Vitamins or Herbal Medications. No Goody's,BC's,Aleve,Advil,Motrin,or Ibuprofen.    Do not wear jewelry.  Do not wear lotions, powders,or deoderant.             Men may shave face and neck.  Do not bring valuables to the hospital.  [facility] for any belongings or [facility]ables.  Contacts, dentures or bridgework may not be worn into surgery.  Leave your suitcase in the car.  After surgery it may be brought to your room.  For patients admitted to the hospital, discharge time will be determined by your treatment team.  Patients discharged the day of surgery will not be allowed to drive home.    [facility] Pre[facility]  Before surgery, you can play an important role.  Because skin is not sterile, your skin needs to be as free of germs as possible.  You can reduce the number of germs on you skin by washing with CHG (chlorahexidine gluconate) soap before surgery.  CHG is an antiseptic cleaner which kills germs and bonds with the skin to continue killing germs even after washing.  Please DO NOT use if you have an allergy to CHG or antibacterial soaps.  If your skin becomes reddened/irritated stop using the CHG and inform your nurse when you arrive at Short Stay.  Do not shave  (including legs and underarms) for at least 48 hours prior to the first CHG shower.  You may shave your face.  Please follow these instructions carefully:   1.  Shower with CHG Soap the night before surgery and the                                morning of Surgery.  2.  If you choose to wash your hair, wash your hair first as usual with your       normal shampoo.  3.  After you shampoo, rinse your hair and body thoroughly to remove the                      Shampoo.  4.  Use CHG as you would any other liquid soap.  You can apply chg directly       to the skin and wash gently with scrungie or a clean washcloth.  5.  Apply the CHG Soap to your body ONLY FROM THE NECK DOWN.        Do not use on open wounds or open sores.  Avoid contact with your eyes,       ears, mouth and genitals (private parts).  Wash genitals (private parts)       with your normal  soap.  6.  Wash thoroughly, paying special attention to the area where your surgery        will be performed.  7.  Thoroughly rinse your body with warm water from the neck down.  8.  DO NOT shower/wash with your normal soap after using and rinsing off       the CHG Soap.  9.  Pat yourself dry with a clean towel.            10.  Wear clean pajamas.            11.  Place clean sheets on your bed the night of your first shower and do not        sleep with pets.  Day of Surgery  Do not apply any lotions/deoderants the morning of surgery.  Please wear clean clothes to the hospital/surgery center.    Please read over the following fact sheets that you were given. Pain Booklet, Coughing and Deep Breathing, MRSA Information and Surgical Site Infection Prevention

## 2016-01-16 ENCOUNTER — Encounter (HOSPITAL_COMMUNITY)
Admission: RE | Admit: 2016-01-16 | Discharge: 2016-01-16 | Disposition: A | Discharge status: Home / Self Care | Payer: Medicare Other | Source: Ambulatory Visit | Attending: Orthopedic Surgery | Admitting: Orthopedic Surgery

## 2016-01-16 ENCOUNTER — Encounter (HOSPITAL_COMMUNITY): Payer: Self-pay

## 2016-01-16 DIAGNOSIS — Z01812 Encounter for preprocedural laboratory examination: Secondary | ICD-10-CM | POA: Diagnosis present

## 2016-01-16 DIAGNOSIS — K449 Diaphragmatic hernia without obstruction or gangrene: Secondary | ICD-10-CM | POA: Diagnosis not present

## 2016-01-16 DIAGNOSIS — Z01818 Encounter for other preprocedural examination: Secondary | ICD-10-CM | POA: Diagnosis present

## 2016-01-16 HISTORY — DX: Restless legs syndrome: G25.81

## 2016-01-16 HISTORY — DX: Other chronic pain: G89.29

## 2016-01-16 HISTORY — DX: Personal history of colonic polyps: Z86.010

## 2016-01-16 HISTORY — DX: Personal history of other specified conditions: Z87.898

## 2016-01-16 HISTORY — DX: Weakness: R53.1

## 2016-01-16 HISTORY — DX: Personal history of colon polyps, unspecified: Z86.0100

## 2016-01-16 HISTORY — DX: Cervicalgia: M54.2

## 2016-01-16 LAB — CBC WITH DIFFERENTIAL/PLATELET
BASOS ABS: 0.1 10*3/uL (ref 0.0–0.1)
BASOS PCT: 1 %
EOS PCT: 3 %
Eosinophils Absolute: 0.2 10*3/uL (ref 0.0–0.7)
HEMATOCRIT: 40 % (ref 39.0–52.0)
Hemoglobin: 14 g/dL (ref 13.0–17.0)
LYMPHS PCT: 23 %
Lymphs Abs: 1.4 10*3/uL (ref 0.7–4.0)
MCH: 33.4 pg (ref 26.0–34.0)
MCHC: 35 g/dL (ref 30.0–36.0)
MCV: 95.5 fL (ref 78.0–100.0)
MONO ABS: 0.6 10*3/uL (ref 0.1–1.0)
Monocytes Relative: 11 %
NEUTROS ABS: 3.7 10*3/uL (ref 1.7–7.7)
Neutrophils Relative %: 62 %
PLATELETS: 131 10*3/uL — AB (ref 150–400)
RBC: 4.19 MIL/uL — ABNORMAL LOW (ref 4.22–5.81)
RDW: 13.4 % (ref 11.5–15.5)
WBC: 5.9 10*3/uL (ref 4.0–10.5)

## 2016-01-16 LAB — URINALYSIS, ROUTINE W REFLEX MICROSCOPIC
BILIRUBIN URINE: NEGATIVE
Glucose, UA: NEGATIVE mg/dL
Hgb urine dipstick: NEGATIVE
KETONES UR: NEGATIVE mg/dL
LEUKOCYTES UA: NEGATIVE
NITRITE: NEGATIVE
PH: 5 (ref 5.0–8.0)
PROTEIN: NEGATIVE mg/dL
Specific Gravity, Urine: 1.017 (ref 1.005–1.030)

## 2016-01-16 LAB — TYPE AND SCREEN
ABO/RH(D): O POS
ANTIBODY SCREEN: NEGATIVE

## 2016-01-16 LAB — COMPREHENSIVE METABOLIC PANEL
ALBUMIN: 4.2 g/dL (ref 3.5–5.0)
ALT: 21 U/L (ref 17–63)
AST: 26 U/L (ref 15–41)
Alkaline Phosphatase: 53 U/L (ref 38–126)
Anion gap: 11 (ref 5–15)
BUN: 21 mg/dL — AB (ref 6–20)
CHLORIDE: 106 mmol/L (ref 101–111)
CO2: 22 mmol/L (ref 22–32)
CREATININE: 1.28 mg/dL — AB (ref 0.61–1.24)
Calcium: 9.8 mg/dL (ref 8.9–10.3)
GFR calc Af Amer: 59 mL/min — ABNORMAL LOW (ref >60)
GFR calc non Af Amer: 51 mL/min — ABNORMAL LOW (ref >60)
GLUCOSE: 111 mg/dL — AB (ref 65–99)
POTASSIUM: 3.6 mmol/L (ref 3.5–5.1)
Sodium: 139 mmol/L (ref 135–145)
Total Bilirubin: 0.4 mg/dL (ref 0.3–1.2)
Total Protein: 6.6 g/dL (ref 6.5–8.1)

## 2016-01-16 LAB — SURGICAL PCR SCREEN
MRSA, PCR: NEGATIVE
STAPHYLOCOCCUS AUREUS: NEGATIVE

## 2016-01-16 LAB — PROTIME-INR
INR: 0.96
Prothrombin Time: 12.8 seconds (ref 11.4–15.2)

## 2016-01-16 LAB — APTT: APTT: 29 s (ref 24–36)

## 2016-01-16 MED ORDER — POVIDONE-IODINE 7.5 % EX SOLN
Freq: Once | CUTANEOUS | Status: DC
  Filled 2016-01-16: qty 118

## 2016-01-16 NOTE — Progress Notes (Addendum)
Cardiologist denies  Medical Md is Dr.Imran Jannette Fogo   Echo denies  Stress test denies  Heart cath denies  EKG in epic from 05-03-15  CXR denies in past yr

## 2016-01-19 ENCOUNTER — Other Ambulatory Visit (HOSPITAL_COMMUNITY): Payer: Medicare Other

## 2016-01-23 NOTE — H&P (Signed)
PREOPERATIVE H&P  Chief Complaint: Deterioration in balance and fine motor skills  HPI: [name] is a 83 y.o. male who presents with ongoing and progressive deterioration in balance and fine motor skills  MRI reveals SCC spanning C3-C7  Patient has failed multiple forms of conservative care and continues to have pain (see office notes for additional details regarding the patient's full course of treatment)  Past Medical History:  Diagnosis Date  . Anxiety    takes Xanax nightly  . Arthritis   . Cancer  [facility])    prostate  . Chronic neck pain   . Depression    wife and son died last yr. Not on any meds  . History of colon polyps    "pre cancerous"  . History of vertigo   . HOH (hard of hearing)   . Hyperlipidemia    takes Pravastatin and Fenofibrate daily  . Hypertension    takes Losartan,Labetalol,and Amlodipine daily  . Restless leg   . Urinary frequency   . Varicose veins   . Weakness    numbness in both hands   Past Surgical History:  Procedure Laterality Date  . BACK SURGERY    . CARPAL TUNNEL RELEASE Left 2017  . COLONOSCOPY    . EYE SURGERY Bilateral    cataract removal  . HERNIA REPAIR    . INNER EAR SURGERY Left    busted ear drum  . JOINT REPLACEMENT Left    knee replacement  . penile prothesis    . PROSTATE SURGERY    . REVERSE SHOULDER ARTHROPLASTY Left 05/04/2015   Procedure: LEFT REVERSE SHOULDER ARTHROPLASTY;  Surgeon: Justice Britain, MD;  Location: [facility];  Service: Orthopedics;  Laterality: Left;   Social History   Social History  . Marital status: Widowed    Spouse name: N/A  . Number of children: N/A  . Years of education: N/A   Social History Main Topics  . Smoking status: Former Research scientist (life sciences)  . Smokeless tobacco: Current User    Types: Chew     Comment: quit smoking in 1982  . Alcohol use No  . Drug use: No  . Sexual activity: Not on file   Other Topics Concern  . Not on file   Social History Narrative  . No narrative on  file   No family history on file. Allergies  Allergen Reactions  . Penicillins Rash    Has patient had a PCN reaction causing immediate rash, facial/tongue/throat swelling, SOB or lightheadedness with hypotension:unsure Has patient had a PCN reaction causing severe rash involving mucus membranes or skin necrosis:unsure Has patient had a PCN reaction that required hospitalization:No Has patient had a PCN reaction occurring within the last 10 years:Yes If all of the above answers are "NO", then may proceed with Cephalosporin use.    Prior to Admission medications   Medication Sig Start Date End Date Taking? Authorizing Provider  albuterol (PROVENTIL HFA;VENTOLIN HFA) 108 (90 Base) MCG/ACT inhaler Inhale 1-2 puffs into the lungs every 6 (six) hours as needed (for shortness of breath/wheezing (scheduled at bedtime per patient)).    Yes Historical Provider, MD  ALPRAZolam (XANAX) 0.25 MG tablet Take 0.25 mg by mouth at bedtime.    Yes Historical Provider, MD  amLODipine (NORVASC) 5 MG tablet Take 5 mg by mouth at bedtime.    Yes Historical Provider, MD  Ascorbic Acid (VITAMIN C) 1000 MG tablet Take 1,000 mg by mouth at bedtime.   Yes Historical Provider, MD  celecoxib (CELEBREX) 200 MG capsule Take 200 mg by mouth at bedtime.    Yes Historical Provider, MD  cholecalciferol (VITAMIN D) 1000 units tablet Take 1,000 Units by mouth 2 (two) times daily.   Yes Historical Provider, MD  fenofibrate micronized (LOFIBRA) 200 MG capsule Take 200 mg by mouth daily before breakfast.   Yes Historical Provider, MD  labetalol (NORMODYNE) 200 MG tablet Take 200 mg by mouth 2 (two) times daily.   Yes Historical Provider, MD  losartan (COZAAR) 50 MG tablet Take 50 mg by mouth at bedtime. 11/12/15  Yes Historical Provider, MD  Multiple Vitamin (MULTIVITAMIN WITH MINERALS) TABS tablet Take 1 tablet by mouth daily. Centrum Silver for Men   Yes Historical Provider, MD  Omega-3 Fatty Acids (FISH OIL) 1000 MG CAPS Take  1,000 mg by mouth 2 (two) times daily.   Yes Historical Provider, MD  pravastatin (PRAVACHOL) 40 MG tablet Take 40 mg by mouth at bedtime.    Yes Historical Provider, MD  rOPINIRole (REQUIP) 0.25 MG tablet Take 0.5-0.75 mg by mouth as directed. Take up to 3 tablet at night (patient typically takes 0.5 mg) 12/20/15  Yes Historical Provider, MD  sulfamethoxazole-trimethoprim (BACTRIM DS,SEPTRA DS) 800-160 MG tablet Take 1 tablet by mouth 2 (two) times daily. 12/20/15  Yes Historical Provider, MD     All other systems have been reviewed and were otherwise negative with the exception of those mentioned in the HPI and as above.  Physical Exam: There were no vitals filed for this visit.  General: Alert, no acute distress Cardiovascular: No pedal edema Respiratory: No cyanosis, no use of accessory musculature Skin: No lesions in the area of chief complaint Neurologic: Sensation intact distally Psychiatric: Patient is competent for consent with normal mood and affect Lymphatic: No axillary or cervical lymphadenopathy  MUSCULOSKELETAL: + hoffmans' sign b/l  Assessment/Plan: Cervical myelopathy  Plan for Procedure(s): ANTERIOR CERVICAL DECOMPRESSION FUSION CERVICAL 3-4 INSTRUMENTATION AND ALLOGRAFT  (to be follow by a posterior cervical decompression and fusion C3-C7 on the following day)   Sinclair Ship, MD 01/23/2016 8:07 AM

## 2016-01-23 NOTE — Anesthesia Preprocedure Evaluation (Addendum)
Anesthesia Evaluation  Patient identified by MRN, date of birth, ID band Patient awake    Reviewed: Allergy & Precautions, H&P , Patient's Chart, lab work & pertinent test results, reviewed documented beta blocker date and time   Airway Mallampati: II  TM Distance: >3 FB Neck ROM: full    Dental no notable dental hx. (+) Edentulous Upper, Edentulous Lower, Dental Advisory Given   Pulmonary former smoker, PE   Pulmonary exam normal breath sounds clear to auscultation       Cardiovascular hypertension,  Rhythm:regular Rate:Normal     Neuro/Psych    GI/Hepatic   Endo/Other    Renal/GU      Musculoskeletal  (+) Arthritis , Osteoarthritis,    Abdominal   Peds  Hematology   Anesthesia Other Findings HTN Myleopathy  4-17:  Ventilation: Mask ventilation without difficulty and Oral airway inserted - appropriate to patient size Laryngoscope Size: Mac and 4 Grade View: Grade I Tube type: Oral Tube size: 7.5 mm Number of attempts: 1  Reproductive/Obstetrics                            Anesthesia Physical Anesthesia Plan  ASA: II  Anesthesia Plan: General   Post-op Pain Management:    Induction: Intravenous  Airway Management Planned: Video Laryngoscope Planned  Additional Equipment:   Intra-op Plan:   Post-operative Plan: Extubation in OR  Informed Consent: I have reviewed the patients History and Physical, chart, labs and discussed the procedure including the risks, benefits and alternatives for the proposed anesthesia with the patient or authorized representative who has indicated his/her understanding and acceptance.   Dental Advisory Given and Dental advisory given  Plan Discussed with: CRNA, Surgeon and Anesthesiologist  Anesthesia Plan Comments: (  Discussed general anesthesia, including possible nausea, instrumentation of airway, sore throat,pulmonary aspiration, etc. I  asked if the were any outstanding questions, or  concerns before we proceeded.)       Anesthesia Quick Evaluation

## 2016-01-24 ENCOUNTER — Encounter (HOSPITAL_COMMUNITY)
Admission: RE | Disposition: A | Discharge status: Skilled Nursing Facility | Payer: Self-pay | Source: Ambulatory Visit | Attending: Orthopedic Surgery

## 2016-01-24 ENCOUNTER — Encounter (HOSPITAL_COMMUNITY): Payer: Self-pay | Admitting: *Deleted

## 2016-01-24 ENCOUNTER — Inpatient Hospital Stay (HOSPITAL_COMMUNITY): Payer: Medicare Other | Admitting: Anesthesiology

## 2016-01-24 ENCOUNTER — Inpatient Hospital Stay (HOSPITAL_COMMUNITY): Payer: Medicare Other

## 2016-01-24 ENCOUNTER — Inpatient Hospital Stay (HOSPITAL_COMMUNITY)
Admission: RE | Admit: 2016-01-24 | Discharge: 2016-01-30 | DRG: 028 | Disposition: A | Discharge status: Skilled Nursing Facility | Payer: Medicare Other | Source: Ambulatory Visit | Attending: Orthopedic Surgery | Admitting: Orthopedic Surgery

## 2016-01-24 DIAGNOSIS — G2581 Restless legs syndrome: Secondary | ICD-10-CM | POA: Diagnosis present

## 2016-01-24 DIAGNOSIS — E876 Hypokalemia: Secondary | ICD-10-CM | POA: Diagnosis not present

## 2016-01-24 DIAGNOSIS — E43 Unspecified severe protein-calorie malnutrition: Secondary | ICD-10-CM | POA: Diagnosis present

## 2016-01-24 DIAGNOSIS — M5412 Radiculopathy, cervical region: Secondary | ICD-10-CM | POA: Diagnosis present

## 2016-01-24 DIAGNOSIS — R451 Restlessness and agitation: Secondary | ICD-10-CM | POA: Diagnosis present

## 2016-01-24 DIAGNOSIS — E878 Other disorders of electrolyte and fluid balance, not elsewhere classified: Secondary | ICD-10-CM | POA: Diagnosis not present

## 2016-01-24 DIAGNOSIS — G8929 Other chronic pain: Secondary | ICD-10-CM | POA: Diagnosis present

## 2016-01-24 DIAGNOSIS — D696 Thrombocytopenia, unspecified: Secondary | ICD-10-CM | POA: Diagnosis present

## 2016-01-24 DIAGNOSIS — J95821 Acute postprocedural respiratory failure: Secondary | ICD-10-CM | POA: Diagnosis not present

## 2016-01-24 DIAGNOSIS — Z720 Tobacco use: Secondary | ICD-10-CM

## 2016-01-24 DIAGNOSIS — Z419 Encounter for procedure for purposes other than remedying health state, unspecified: Secondary | ICD-10-CM

## 2016-01-24 DIAGNOSIS — G9520 Unspecified cord compression: Secondary | ICD-10-CM | POA: Diagnosis present

## 2016-01-24 DIAGNOSIS — J384 Edema of larynx: Secondary | ICD-10-CM | POA: Diagnosis not present

## 2016-01-24 DIAGNOSIS — R739 Hyperglycemia, unspecified: Secondary | ICD-10-CM | POA: Diagnosis present

## 2016-01-24 DIAGNOSIS — Z8546 Personal history of malignant neoplasm of prostate: Secondary | ICD-10-CM

## 2016-01-24 DIAGNOSIS — E785 Hyperlipidemia, unspecified: Secondary | ICD-10-CM | POA: Diagnosis present

## 2016-01-24 DIAGNOSIS — Z880 Allergy status to penicillin: Secondary | ICD-10-CM

## 2016-01-24 DIAGNOSIS — F419 Anxiety disorder, unspecified: Secondary | ICD-10-CM | POA: Diagnosis present

## 2016-01-24 DIAGNOSIS — I10 Essential (primary) hypertension: Secondary | ICD-10-CM | POA: Diagnosis present

## 2016-01-24 DIAGNOSIS — Z6824 Body mass index (BMI) 24.0-24.9, adult: Secondary | ICD-10-CM

## 2016-01-24 DIAGNOSIS — F329 Major depressive disorder, single episode, unspecified: Secondary | ICD-10-CM | POA: Diagnosis present

## 2016-01-24 DIAGNOSIS — Z79899 Other long term (current) drug therapy: Secondary | ICD-10-CM

## 2016-01-24 DIAGNOSIS — J9601 Acute respiratory failure with hypoxia: Secondary | ICD-10-CM

## 2016-01-24 DIAGNOSIS — R1313 Dysphagia, pharyngeal phase: Secondary | ICD-10-CM | POA: Diagnosis not present

## 2016-01-24 DIAGNOSIS — M5410 Radiculopathy, site unspecified: Secondary | ICD-10-CM | POA: Diagnosis present

## 2016-01-24 DIAGNOSIS — H9190 Unspecified hearing loss, unspecified ear: Secondary | ICD-10-CM | POA: Diagnosis present

## 2016-01-24 DIAGNOSIS — M542 Cervicalgia: Secondary | ICD-10-CM | POA: Diagnosis present

## 2016-01-24 HISTORY — PX: ANTERIOR CERVICAL DECOMP/DISCECTOMY FUSION: SHX1161

## 2016-01-24 LAB — GLUCOSE, CAPILLARY: GLUCOSE-CAPILLARY: 178 mg/dL — AB (ref 65–99)

## 2016-01-24 SURGERY — ANTERIOR CERVICAL DECOMPRESSION/DISCECTOMY FUSION 1 LEVEL
Anesthesia: General

## 2016-01-24 MED ORDER — EPHEDRINE SULFATE 50 MG/ML IJ SOLN
INTRAMUSCULAR | Status: DC | PRN
  Administered 2016-01-24: 15 mg via INTRAVENOUS

## 2016-01-24 MED ORDER — ALPRAZOLAM 0.25 MG PO TABS
0.2500 mg | ORAL_TABLET | Freq: Every day | ORAL | Status: DC
  Administered 2016-01-24: 0.25 mg via ORAL
  Filled 2016-01-24: qty 1

## 2016-01-24 MED ORDER — VANCOMYCIN HCL 10 G IV SOLR
1250.0000 mg | INTRAVENOUS | Status: DC
  Filled 2016-01-24: qty 1250

## 2016-01-24 MED ORDER — SULFAMETHOXAZOLE-TRIMETHOPRIM 800-160 MG PO TABS: 1.0000 | ORAL_TABLET | Freq: Two times a day (BID) | ORAL | Status: DC

## 2016-01-24 MED ORDER — MORPHINE SULFATE (PF) 2 MG/ML IV SOLN: 1.0000 mg | INTRAVENOUS | Status: DC | PRN

## 2016-01-24 MED ORDER — SODIUM CHLORIDE 0.9 % IV SOLN
INTRAVENOUS | Status: DC
  Administered 2016-01-24: 22:00:00 via INTRAVENOUS

## 2016-01-24 MED ORDER — BUPIVACAINE HCL (PF) 0.25 % IJ SOLN
INTRAMUSCULAR | Status: AC
  Filled 2016-01-24: qty 30

## 2016-01-24 MED ORDER — HYDROCODONE-ACETAMINOPHEN 5-325 MG PO TABS
1.0000 | ORAL_TABLET | ORAL | Status: DC | PRN
  Administered 2016-01-24 (×2): 2 via ORAL
  Filled 2016-01-24 (×2): qty 2

## 2016-01-24 MED ORDER — MIDAZOLAM HCL 5 MG/5ML IJ SOLN
INTRAMUSCULAR | Status: DC | PRN
  Administered 2016-01-24: 2 mg via INTRAVENOUS

## 2016-01-24 MED ORDER — MENTHOL 3 MG MT LOZG: 1.0000 | LOZENGE | OROMUCOSAL | Status: DC | PRN

## 2016-01-24 MED ORDER — INSULIN ASPART 100 UNIT/ML ~~LOC~~ SOLN: 0.0000 [IU] | Freq: Three times a day (TID) | SUBCUTANEOUS | Status: DC

## 2016-01-24 MED ORDER — MIDAZOLAM HCL 2 MG/2ML IJ SOLN
INTRAMUSCULAR | Status: AC
  Filled 2016-01-24: qty 2

## 2016-01-24 MED ORDER — LACTATED RINGERS IV SOLN
INTRAVENOUS | Status: DC | PRN
  Administered 2016-01-24 (×2): via INTRAVENOUS

## 2016-01-24 MED ORDER — MORPHINE SULFATE (PF) 4 MG/ML IV SOLN: 1.0000 mg | INTRAVENOUS | Status: DC | PRN

## 2016-01-24 MED ORDER — PHENYLEPHRINE HCL 10 MG/ML IJ SOLN
INTRAVENOUS | Status: DC | PRN
  Administered 2016-01-24: 40 ug/min via INTRAVENOUS

## 2016-01-24 MED ORDER — EPINEPHRINE PF 1 MG/ML IJ SOLN
INTRAMUSCULAR | Status: AC
  Filled 2016-01-24: qty 1

## 2016-01-24 MED ORDER — ACETAMINOPHEN 650 MG RE SUPP: 650.0000 mg | RECTAL | Status: DC | PRN

## 2016-01-24 MED ORDER — SENNOSIDES-DOCUSATE SODIUM 8.6-50 MG PO TABS
1.0000 | ORAL_TABLET | Freq: Every evening | ORAL | Status: DC | PRN
Start: 2016-01-24 — End: 2016-01-25

## 2016-01-24 MED ORDER — SODIUM CHLORIDE 0.9% FLUSH
3.0000 mL | Freq: Two times a day (BID) | INTRAVENOUS | Status: DC
  Administered 2016-01-24 (×2): 3 mL via INTRAVENOUS
  Administered 2016-01-25 – 2016-01-26 (×2): 10 mL via INTRAVENOUS
  Administered 2016-01-27: 6 mL via INTRAVENOUS
  Administered 2016-01-27: 10 mL via INTRAVENOUS
  Administered 2016-01-28: 3 mL via INTRAVENOUS
  Administered 2016-01-28: 6 mL via INTRAVENOUS
  Administered 2016-01-29: 3 mL via INTRAVENOUS
  Administered 2016-01-29: 10 mL via INTRAVENOUS
  Administered 2016-01-30: 3 mL via INTRAVENOUS

## 2016-01-24 MED ORDER — FENTANYL CITRATE (PF) 100 MCG/2ML IJ SOLN
INTRAMUSCULAR | Status: DC | PRN
  Administered 2016-01-24 (×3): 50 ug via INTRAVENOUS

## 2016-01-24 MED ORDER — EPHEDRINE 5 MG/ML INJ
INTRAVENOUS | Status: AC
  Filled 2016-01-24: qty 10

## 2016-01-24 MED ORDER — FENTANYL CITRATE (PF) 100 MCG/2ML IJ SOLN
25.0000 ug | INTRAMUSCULAR | Status: DC | PRN
  Administered 2016-01-24 (×2): 50 ug via INTRAVENOUS

## 2016-01-24 MED ORDER — FENOFIBRATE 160 MG PO TABS
160.0000 mg | ORAL_TABLET | Freq: Every day | ORAL | Status: DC
  Administered 2016-01-24: 160 mg via ORAL
  Filled 2016-01-24: qty 1

## 2016-01-24 MED ORDER — SODIUM CHLORIDE 0.9 % IV SOLN: 250.0000 mL | INTRAVENOUS | Status: DC

## 2016-01-24 MED ORDER — DIAZEPAM 5 MG PO TABS
5.0000 mg | ORAL_TABLET | Freq: Four times a day (QID) | ORAL | Status: DC | PRN
  Filled 2016-01-24: qty 1

## 2016-01-24 MED ORDER — VITAMIN D 1000 UNITS PO TABS
1000.0000 [IU] | ORAL_TABLET | Freq: Two times a day (BID) | ORAL | Status: DC
  Administered 2016-01-27 – 2016-01-30 (×5): 1000 [IU] via ORAL
  Filled 2016-01-24 (×7): qty 1

## 2016-01-24 MED ORDER — ACETAMINOPHEN 325 MG PO TABS: 650.0000 mg | ORAL_TABLET | ORAL | Status: DC | PRN

## 2016-01-24 MED ORDER — ONDANSETRON HCL 4 MG/2ML IJ SOLN: 4.0000 mg | INTRAMUSCULAR | Status: DC | PRN

## 2016-01-24 MED ORDER — ALUM & MAG HYDROXIDE-SIMETH 200-200-20 MG/5ML PO SUSP: 30.0000 mL | Freq: Four times a day (QID) | ORAL | Status: DC | PRN

## 2016-01-24 MED ORDER — OXYCODONE-ACETAMINOPHEN 5-325 MG PO TABS
1.0000 | ORAL_TABLET | ORAL | Status: DC | PRN
Start: 2016-01-24 — End: 2016-01-25

## 2016-01-24 MED ORDER — SODIUM CHLORIDE 0.9% FLUSH: 3.0000 mL | INTRAVENOUS | Status: DC | PRN

## 2016-01-24 MED ORDER — VANCOMYCIN HCL 10 G IV SOLR
1250.0000 mg | Freq: Once | INTRAVENOUS | Status: AC
  Administered 2016-01-25: 1250 mg via INTRAVENOUS
  Filled 2016-01-24 (×2): qty 1250

## 2016-01-24 MED ORDER — DOCUSATE SODIUM 100 MG PO CAPS
100.0000 mg | ORAL_CAPSULE | Freq: Two times a day (BID) | ORAL | Status: DC
  Administered 2016-01-24 – 2016-01-30 (×6): 100 mg via ORAL
  Filled 2016-01-24 (×6): qty 1

## 2016-01-24 MED ORDER — FENTANYL CITRATE (PF) 100 MCG/2ML IJ SOLN
INTRAMUSCULAR | Status: AC
  Filled 2016-01-24: qty 4

## 2016-01-24 MED ORDER — ONDANSETRON HCL 4 MG/2ML IJ SOLN
INTRAMUSCULAR | Status: DC | PRN
  Administered 2016-01-24: 4 mg via INTRAVENOUS

## 2016-01-24 MED ORDER — LOSARTAN POTASSIUM 50 MG PO TABS
50.0000 mg | ORAL_TABLET | Freq: Every day | ORAL | Status: DC
  Administered 2016-01-24: 50 mg via ORAL
  Filled 2016-01-24: qty 1

## 2016-01-24 MED ORDER — THROMBIN 20000 UNITS EX KIT
PACK | CUTANEOUS | Status: DC | PRN
  Administered 2016-01-24: 20000 [IU] via TOPICAL

## 2016-01-24 MED ORDER — MEPERIDINE HCL 25 MG/ML IJ SOLN: 6.2500 mg | INTRAMUSCULAR | Status: DC | PRN

## 2016-01-24 MED ORDER — LIDOCAINE HCL (CARDIAC) 20 MG/ML IV SOLN
INTRAVENOUS | Status: DC | PRN
  Administered 2016-01-24: 100 mg via INTRAVENOUS

## 2016-01-24 MED ORDER — PHENYLEPHRINE 40 MCG/ML (10ML) SYRINGE FOR IV PUSH (FOR BLOOD PRESSURE SUPPORT)
PREFILLED_SYRINGE | INTRAVENOUS | Status: AC
Start: 2016-01-24 — End: 2016-01-24
  Filled 2016-01-24: qty 20

## 2016-01-24 MED ORDER — FENTANYL CITRATE (PF) 100 MCG/2ML IJ SOLN
INTRAMUSCULAR | Status: AC
  Administered 2016-01-24: 50 ug via INTRAVENOUS
  Filled 2016-01-24: qty 2

## 2016-01-24 MED ORDER — LABETALOL HCL 200 MG PO TABS
200.0000 mg | ORAL_TABLET | Freq: Two times a day (BID) | ORAL | Status: DC
  Filled 2016-01-24 (×2): qty 1

## 2016-01-24 MED ORDER — ROPINIROLE HCL 1 MG PO TABS
0.5000 mg | ORAL_TABLET | Freq: Every day | ORAL | Status: DC
  Administered 2016-01-24: 0.5 mg via ORAL
  Filled 2016-01-24 (×2): qty 1

## 2016-01-24 MED ORDER — THROMBIN 20000 UNITS EX SOLR
CUTANEOUS | Status: AC
  Filled 2016-01-24: qty 20000

## 2016-01-24 MED ORDER — VITAMIN C 500 MG PO TABS
1000.0000 mg | ORAL_TABLET | Freq: Every day | ORAL | Status: DC
  Filled 2016-01-24 (×2): qty 2

## 2016-01-24 MED ORDER — BISACODYL 5 MG PO TBEC: 5.0000 mg | DELAYED_RELEASE_TABLET | Freq: Every day | ORAL | Status: DC | PRN

## 2016-01-24 MED ORDER — AMLODIPINE BESYLATE 5 MG PO TABS
5.0000 mg | ORAL_TABLET | Freq: Every day | ORAL | Status: DC
  Administered 2016-01-24: 5 mg via ORAL
  Filled 2016-01-24: qty 1

## 2016-01-24 MED ORDER — PROPOFOL 10 MG/ML IV BOLUS
INTRAVENOUS | Status: AC
  Filled 2016-01-24: qty 20

## 2016-01-24 MED ORDER — SCOPOLAMINE 1 MG/3DAYS TD PT72
MEDICATED_PATCH | TRANSDERMAL | Status: AC
  Filled 2016-01-24: qty 1

## 2016-01-24 MED ORDER — ALBUTEROL SULFATE (2.5 MG/3ML) 0.083% IN NEBU
3.0000 mL | INHALATION_SOLUTION | Freq: Four times a day (QID) | RESPIRATORY_TRACT | Status: DC | PRN
  Administered 2016-01-25: 3 mL via RESPIRATORY_TRACT
  Filled 2016-01-24: qty 3

## 2016-01-24 MED ORDER — FLEET ENEMA 7-19 GM/118ML RE ENEM: 1.0000 | ENEMA | Freq: Once | RECTAL | Status: DC | PRN

## 2016-01-24 MED ORDER — ZOLPIDEM TARTRATE 5 MG PO TABS
5.0000 mg | ORAL_TABLET | Freq: Every evening | ORAL | Status: DC | PRN
  Administered 2016-01-25: 5 mg via ORAL
  Filled 2016-01-24: qty 1

## 2016-01-24 MED ORDER — SUCCINYLCHOLINE CHLORIDE 200 MG/10ML IV SOSY
PREFILLED_SYRINGE | INTRAVENOUS | Status: AC
  Filled 2016-01-24: qty 10

## 2016-01-24 MED ORDER — SUGAMMADEX SODIUM 500 MG/5ML IV SOLN
INTRAVENOUS | Status: DC | PRN
  Administered 2016-01-24: 200 mg via INTRAVENOUS

## 2016-01-24 MED ORDER — ONDANSETRON HCL 4 MG/2ML IJ SOLN
INTRAMUSCULAR | Status: AC
  Filled 2016-01-24: qty 2

## 2016-01-24 MED ORDER — ROCURONIUM BROMIDE 100 MG/10ML IV SOLN
INTRAVENOUS | Status: DC | PRN
  Administered 2016-01-24: 50 mg via INTRAVENOUS

## 2016-01-24 MED ORDER — BUPIVACAINE-EPINEPHRINE 0.25% -1:200000 IJ SOLN
INTRAMUSCULAR | Status: DC | PRN
  Administered 2016-01-24: 5 mL

## 2016-01-24 MED ORDER — ADULT MULTIVITAMIN W/MINERALS CH
1.0000 | ORAL_TABLET | Freq: Every day | ORAL | Status: DC
  Administered 2016-01-29 – 2016-01-30 (×2): 1 via ORAL
  Filled 2016-01-24 (×2): qty 1

## 2016-01-24 MED ORDER — PRAVASTATIN SODIUM 40 MG PO TABS
40.0000 mg | ORAL_TABLET | Freq: Every day | ORAL | Status: DC
  Administered 2016-01-24: 40 mg via ORAL
  Filled 2016-01-24: qty 1

## 2016-01-24 MED ORDER — PROPOFOL 10 MG/ML IV BOLUS
INTRAVENOUS | Status: DC | PRN
Start: 2016-01-24 — End: 2016-01-24
  Administered 2016-01-24: 90 mg via INTRAVENOUS

## 2016-01-24 MED ORDER — 0.9 % SODIUM CHLORIDE (POUR BTL) OPTIME
TOPICAL | Status: DC | PRN
  Administered 2016-01-24: 1000 mL

## 2016-01-24 MED ORDER — ONDANSETRON HCL 4 MG/2ML IJ SOLN: 4.0000 mg | Freq: Once | INTRAMUSCULAR | Status: DC | PRN

## 2016-01-24 MED ORDER — VANCOMYCIN HCL IN DEXTROSE 1-5 GM/200ML-% IV SOLN
1000.0000 mg | INTRAVENOUS | Status: AC
  Administered 2016-01-24: 1000 mg via INTRAVENOUS
  Filled 2016-01-24: qty 200

## 2016-01-24 MED ORDER — PHENOL 1.4 % MT LIQD
1.0000 | OROMUCOSAL | Status: DC | PRN
  Filled 2016-01-24: qty 177

## 2016-01-24 MED ORDER — DEXAMETHASONE SODIUM PHOSPHATE 10 MG/ML IJ SOLN
INTRAMUSCULAR | Status: AC
  Filled 2016-01-24: qty 2

## 2016-01-24 SURGICAL SUPPLY — 75 items
APL SKNCLS STERI-STRIP NONHPOA (GAUZE/BANDAGES/DRESSINGS) ×1
BENZOIN TINCTURE PRP APPL 2/3 (GAUZE/BANDAGES/DRESSINGS) ×3 IMPLANT
BIT DRILL NEURO 2X3.1 SFT TUCH (MISCELLANEOUS) ×1 IMPLANT
BIT DRILL SRG 14X2.2XFLT CHK (BIT) IMPLANT
BIT DRL SRG 14X2.2XFLT CHK (BIT) ×1
BLADE SURG 15 STRL LF DISP TIS (BLADE) ×1 IMPLANT
BLADE SURG 15 STRL SS (BLADE) ×3
BLADE SURG ROTATE 9660 (MISCELLANEOUS) ×3 IMPLANT
BONE VIVIGEN FORMABLE 1.3CC (Bone Implant) ×3 IMPLANT
BUR MATCHSTICK NEURO 3.0 LAGG (BURR) IMPLANT
CARTRIDGE OIL MAESTRO DRILL (MISCELLANEOUS) ×1 IMPLANT
CLOSURE WOUND 1/2 X4 (GAUZE/BANDAGES/DRESSINGS) ×1
CORDS BIPOLAR (ELECTRODE) ×3 IMPLANT
COVER SURGICAL LIGHT HANDLE (MISCELLANEOUS) ×3 IMPLANT
CRADLE DONUT ADULT HEAD (MISCELLANEOUS) ×3 IMPLANT
DIFFUSER DRILL AIR PNEUMATIC (MISCELLANEOUS) ×3 IMPLANT
DRAIN JACKSON RD 7FR 3/32 (WOUND CARE) IMPLANT
DRAPE C-ARM 42X72 X-RAY (DRAPES) ×3 IMPLANT
DRAPE POUCH INSTRU U-SHP 10X18 (DRAPES) ×3 IMPLANT
DRAPE SURG 17X23 STRL (DRAPES) ×9 IMPLANT
DRILL BIT SKYLINE 14MM (BIT) ×3
DRILL NEURO 2X3.1 SOFT TOUCH (MISCELLANEOUS) ×3
DURAPREP 26ML APPLICATOR (WOUND CARE) ×3 IMPLANT
ELECT COATED BLADE 2.86 ST (ELECTRODE) ×3 IMPLANT
ELECT REM PT RETURN 9FT ADLT (ELECTROSURGICAL) ×3
ELECTRODE REM PT RTRN 9FT ADLT (ELECTROSURGICAL) ×1 IMPLANT
EVACUATOR SILICONE 100CC (DRAIN) IMPLANT
GAUZE SPONGE 4X4 12PLY STRL (GAUZE/BANDAGES/DRESSINGS) ×3 IMPLANT
GAUZE SPONGE 4X4 16PLY XRAY LF (GAUZE/BANDAGES/DRESSINGS) ×3 IMPLANT
GLOVE BIO SURGEON STRL SZ7 (GLOVE) ×5 IMPLANT
GLOVE BIO SURGEON STRL SZ8 (GLOVE) ×3 IMPLANT
GLOVE BIOGEL PI IND STRL 7.0 (GLOVE) ×2 IMPLANT
GLOVE BIOGEL PI IND STRL 8 (GLOVE) ×1 IMPLANT
GLOVE BIOGEL PI INDICATOR 7.0 (GLOVE) ×6
GLOVE BIOGEL PI INDICATOR 8 (GLOVE) ×2
GOWN STRL REUS W/ TWL LRG LVL3 (GOWN DISPOSABLE) ×1 IMPLANT
GOWN STRL REUS W/ TWL XL LVL3 (GOWN DISPOSABLE) ×1 IMPLANT
GOWN STRL REUS W/TWL LRG LVL3 (GOWN DISPOSABLE) ×6
GOWN STRL REUS W/TWL XL LVL3 (GOWN DISPOSABLE) ×3
GRAFT BNE MATRIX VG FRMBL SM 1 (Bone Implant) IMPLANT
INTERLOCK LRDTC CRVCL VBR 6MM (Bone Implant) IMPLANT
IV CATH 14GX2 1/4 (CATHETERS) ×3 IMPLANT
KIT BASIN OR (CUSTOM PROCEDURE TRAY) ×3 IMPLANT
KIT ROOM TURNOVER OR (KITS) ×3 IMPLANT
LORDOTIC CERVICAL VBR 6MM SM (Bone Implant) ×3 IMPLANT
MANIFOLD NEPTUNE II (INSTRUMENTS) ×3 IMPLANT
NDL PRECISIONGLIDE 27X1.5 (NEEDLE) ×1 IMPLANT
NDL SPNL 20GX3.5 QUINCKE YW (NEEDLE) ×1 IMPLANT
NEEDLE PRECISIONGLIDE 27X1.5 (NEEDLE) ×3 IMPLANT
NEEDLE SPNL 20GX3.5 QUINCKE YW (NEEDLE) ×3 IMPLANT
NS IRRIG 1000ML POUR BTL (IV SOLUTION) ×5 IMPLANT
OIL CARTRIDGE MAESTRO DRILL (MISCELLANEOUS) ×3
PACK ORTHO CERVICAL (CUSTOM PROCEDURE TRAY) ×3 IMPLANT
PAD ARMBOARD 7.5X6 YLW CONV (MISCELLANEOUS) ×6 IMPLANT
PATTIES SURGICAL .5 X.5 (GAUZE/BANDAGES/DRESSINGS) IMPLANT
PATTIES SURGICAL .5 X1 (DISPOSABLE) ×2 IMPLANT
PIN DISTRACTION 14 (PIN) ×4 IMPLANT
PLATE SKYLINE 12MM (Plate) ×2 IMPLANT
SCREW SKYLINE VAR OS 14MM (Screw) ×8 IMPLANT
SPONGE INTESTINAL PEANUT (DISPOSABLE) ×3 IMPLANT
SPONGE SURGIFOAM ABS GEL 100 (HEMOSTASIS) IMPLANT
STRIP CLOSURE SKIN 1/2X4 (GAUZE/BANDAGES/DRESSINGS) ×2 IMPLANT
SURGIFLO W/THROMBIN 8M KIT (HEMOSTASIS) IMPLANT
SUT MNCRL AB 4-0 PS2 18 (SUTURE) IMPLANT
SUT SILK 4 0 (SUTURE)
SUT SILK 4-0 18XBRD TIE 12 (SUTURE) IMPLANT
SUT VIC AB 2-0 CT2 18 VCP726D (SUTURE) ×3 IMPLANT
SYR BULB IRRIGATION 50ML (SYRINGE) ×3 IMPLANT
SYR CONTROL 10ML LL (SYRINGE) ×6 IMPLANT
TAPE CLOTH 4X10 WHT NS (GAUZE/BANDAGES/DRESSINGS) ×1 IMPLANT
TAPE UMBILICAL COTTON 1/8X30 (MISCELLANEOUS) ×3 IMPLANT
TOWEL OR 17X24 6PK STRL BLUE (TOWEL DISPOSABLE) ×3 IMPLANT
TOWEL OR 17X26 10 PK STRL BLUE (TOWEL DISPOSABLE) ×3 IMPLANT
WATER STERILE IRR 1000ML POUR (IV SOLUTION) ×1 IMPLANT
YANKAUER SUCT BULB TIP NO VENT (SUCTIONS) ×3 IMPLANT

## 2016-01-24 NOTE — Progress Notes (Signed)
Pharmacy Antibiotic Note  [name] is a 82 y.o. male admitted on 01/24/2016 with surgical prophylaxis.  Pharmacy has been consulted for vancomycin dosing.  Patient does not have a drain in place. Pre-op vancomycin dose given at 0825 this morning.  Plan: vancomycin '1250mg'$  IV x1 to be given at [place] to sign off as no future doses required. Please re-consult if needed.     Temp (24hrs), Avg:97.9 F (36.6 C), Min:97.5 F (36.4 C), Max:98.3 F (36.8 C)  No results for input(s): WBC, CREATININE, LATICACIDVEN, VANCOTROUGH, VANCOPEAK, VANCORANDOM, GENTTROUGH, GENTPEAK, GENTRANDOM, TOBRATROUGH, TOBRAPEAK, TOBRARND, AMIKACINPEAK, AMIKACINTROU, AMIKACIN in the last 168 hours.  Estimated Creatinine Clearance: 50.5 mL/min (by C-G formula based on SCr of 1.28 mg/dL (H)).    Allergies  Allergen Reactions  . Penicillins Rash    Has patient had a PCN reaction causing immediate rash, facial/tongue/throat swelling, SOB or lightheadedness with hypotension:unsure Has patient had a PCN reaction causing severe rash involving mucus membranes or skin necrosis:unsure Has patient had a PCN reaction that required hospitalization:No Has patient had a PCN reaction occurring within the last 10 years:Yes If all of the above answers are "NO", then may proceed with Cephalosporin use.       Thank you for allowing pharmacy to be a part of this patient's care.  Pharrell Ledford 01/24/2016 1:17 PM

## 2016-01-24 NOTE — Anesthesia Procedure Notes (Addendum)
Procedure Name: Intubation Date/Time: 01/24/2016 8:50 AM Performed by: [facility] Pre-anesthesia Checklist: Patient identified, Emergency Drugs available, Suction available, Patient being monitored and Timeout performed Patient Re-evaluated:Patient Re-evaluated prior to inductionOxygen Delivery Method: Circle system utilized Preoxygenation: Pre-oxygenation with 100% oxygen Intubation Type: IV induction Ventilation: Mask ventilation without difficulty and Oral airway inserted - appropriate to patient size Laryngoscope Size: Glidescope and 4 Grade View: Grade I Tube type: Oral Tube size: 7.5 mm Number of attempts: 1 Airway Equipment and Method: Stylet and Video-laryngoscopy Placement Confirmation: ETT inserted through vocal cords under direct vision,  positive ETCO2 and breath sounds checked- equal and bilateral Secured at: 22 cm Tube secured with: Tape Dental Injury: Teeth and Oropharynx as per pre-operative assessment  Comments: Pt with cervical myelopathy, Glidescope utilized, Easy one handed mask ventilation with oral airway, neck neutral throughout DL and ETT placement. VSS

## 2016-01-24 NOTE — Progress Notes (Signed)
New Admission Note:  Arrival Method: wheelcahir Mental Orientation: A & O x 4 Telemetry: n/a Assessment: Completed Skin: surgical incision on neck  IV: L hand saline locked  Pain: 0/10 Safety Measures: Safety Fall Prevention Plan was given, discussed. Admission: Completed 5c02: Patient has been orientated to the room, unit and the staff. Family:updated  Orders have been reviewed and implemented. Will continue to monitor the patient. Call light has been placed within reach and bed alarm has been activated.   Arta Silence ,RN

## 2016-01-24 NOTE — [place] FL2 (Signed)
MEDICAID FL2 LEVEL OF CARE SCREENING TOOL     IDENTIFICATION  Patient Name: [name] Birthdate: 1933-06-24 Sex: male Admission Date (Current Location): 01/24/2016  [facility] and [place] Number:  Herbalist and Address:  [facility], [place] [address], [place], [place] [address]      Provider Number: [id number]  Attending Physician Name and Address:  Phylliss Bob, MD  Relative Name and Phone Number:       Current Level of Care: Hospital Recommended Level of Care: [place] Prior Approval Number:    Date Approved/Denied: 11/01/09 PASRR Number: [id number]  Discharge Plan: SNF    Current Diagnoses: Patient Active Problem List   Diagnosis Date Noted  . S/p reverse total shoulder arthroplasty 05/04/2015  . Varicose veins of right lower extremity with complications 81/85/6314  . Varicose veins of leg with complications 97/02/6376    Orientation RESPIRATION BLADDER Height & Weight     Self, Time, Situation, Place  Normal Continent Weight:   Height:     BEHAVIORAL SYMPTOMS/MOOD NEUROLOGICAL BOWEL NUTRITION STATUS      Continent  (Please see discharge summary)  AMBULATORY STATUS COMMUNICATION OF NEEDS Skin   Limited Assist Verbally Surgical wounds (Closed incision on neck)                       Personal Care Assistance Level of Assistance  Bathing, Feeding, Dressing Bathing Assistance: Limited assistance Feeding assistance: Independent Dressing Assistance: Limited assistance     Functional Limitations Info  Sight, Hearing, Speech Sight Info: Adequate Hearing Info: Adequate Speech Info: Adequate    SPECIAL CARE FACTORS FREQUENCY  PT (By licensed PT), OT (By licensed OT)     PT Frequency: min 5x week OT Frequency:  min 5x week            Contractures Contractures Info: Not present    Additional Factors Info  Code Status, Allergies Code Status Info: No code on file Allergies Info:  Penicillins           Current Medications (01/24/2016):  This is the current hospital active medication list Current Facility-Administered Medications  Medication Dose Route Frequency Provider Last Rate Last Dose  . 0.9 % irrigation (POUR BTL)    PRN Phylliss Bob, MD   1,000 mL at 01/24/16 0923  . bupivacaine-EPINEPHrine (MARCAINE W/ EPI) 0.25% -1:200000 (with pres) injection    PRN Phylliss Bob, MD   5 mL at 01/24/16 0912  . thrombin spray    PRN Phylliss Bob, MD   20,000 Units at 01/24/16 [phone]  . [START ON 01/25/2016] vancomycin (VANCOCIN) 1,250 mg in sodium chloride 0.9 % 250 mL IVPB  1,250 mg Intravenous To SSTC Phylliss Bob, MD       Facility-Administered Medications Ordered in Other Encounters  Medication Dose Route Frequency Provider Last Rate Last Dose  . ePHEDrine injection    Anesthesia Intra-op Carney Living, CRNA   15 mg at 01/24/16 0902  . fentaNYL (SUBLIMAZE) injection    Anesthesia Intra-op Carney Living, CRNA   50 mcg at 01/24/16 0936  . lactated ringers infusion    Continuous PRN Carney Living, CRNA      . lidocaine (cardiac) 100 mg/11m (XYLOCAINE) 20 MG/ML injection 2%    Anesthesia Intra-op TCarney Living CRNA   100 mg at 01/24/16 0847  . midazolam (VERSED) 5 MG/5ML injection    Anesthesia Intra-op TCarney Living CRNA  2 mg at 01/24/16 0830  . phenylephrine (NEO-SYNEPHRINE) 0.04 mg/mL in dextrose 5 % 250 mL infusion    Continuous PRN Carney Living, CRNA 60 mL/hr at 01/24/16 0920 40 mcg/min at 01/24/16 0920  . propofol (DIPRIVAN) 10 mg/mL bolus/IV push    Anesthesia Intra-op Carney Living, CRNA   90 mg at 01/24/16 0847  . [facility]) injection    Anesthesia Intra-op Carney Living, CRNA   50 mg at 01/24/16 6812     Discharge Medications: Please see discharge summary for a list of discharge medications.  Relevant Imaging Results:  Relevant Lab Results:   Additional Information SSN: [id number]  Ht: 6' (1.829 m)  Wt: 182 lb 1.6  oz (82.6 kg)   CSW will update Fl2 following surgery.  [address] [place]

## 2016-01-24 NOTE — Transfer of Care (Signed)
Immediate Anesthesia Transfer of Care Note  Patient: [name]  Procedure(s) Performed: Procedure(s) with comments: ANTERIOR CERVICAL DECOMPRESSION FUSION CERVICAL 3-4 INSTRUMENTATION AND ALLOGRAFT (N/A) - ANTERIOR CERVICAL DECOMPRESSION FUSION CERVICAL 3-4 INSTRUMENTATION AND ALLOGRAFT  Patient Location: PACU  Anesthesia Type:General  Level of Consciousness: awake, alert , oriented and patient cooperative  Airway & Oxygen Therapy: Patient Spontanous Breathing and Patient connected to nasal cannula oxygen  Post-op Assessment: Report given to RN, Post -op Vital signs reviewed and stable, Patient moving all extremities X 4 and Patient able to stick tongue midline  Post vital signs: Reviewed and stable  Last Vitals:  Vitals:   01/24/16 0643  BP: 122/60  Pulse: 75  Resp: 20  Temp: 36.8 C    Last Pain:  Vitals:   01/24/16 0643  TempSrc: Oral         Complications: No apparent anesthesia complications

## 2016-01-24 NOTE — Anesthesia Postprocedure Evaluation (Signed)
Anesthesia Post Note  Patient: [name]  Procedure(s) Performed: Procedure(s) (LRB): ANTERIOR CERVICAL DECOMPRESSION FUSION CERVICAL 3-4 INSTRUMENTATION AND ALLOGRAFT (N/A)  Patient location during evaluation: PACU Anesthesia Type: General Level of consciousness: sedated Pain management: satisfactory to patient Vital Signs Assessment: post-procedure vital signs reviewed and stable Respiratory status: spontaneous breathing Cardiovascular status: stable Anesthetic complications: no       Last Vitals:  Vitals:   01/24/16 1245 01/24/16 1306  BP: (!) 142/78 135/74  Pulse: 76 75  Resp: 14 16  Temp: 36.6 C 36.7 C    Last Pain:  Vitals:   01/24/16 1451  TempSrc:   PainSc: [place]

## 2016-01-25 ENCOUNTER — Inpatient Hospital Stay (HOSPITAL_COMMUNITY): Payer: Medicare Other | Admitting: Anesthesiology

## 2016-01-25 ENCOUNTER — Encounter (HOSPITAL_COMMUNITY)
Admission: RE | Disposition: A | Discharge status: Skilled Nursing Facility | Payer: Self-pay | Source: Ambulatory Visit | Attending: Orthopedic Surgery

## 2016-01-25 ENCOUNTER — Inpatient Hospital Stay (HOSPITAL_COMMUNITY): Payer: Medicare Other

## 2016-01-25 ENCOUNTER — Encounter (HOSPITAL_COMMUNITY): Payer: Self-pay | Admitting: Anesthesiology

## 2016-01-25 ENCOUNTER — Inpatient Hospital Stay (HOSPITAL_COMMUNITY): Admission: RE | Admit: 2016-01-25 | Payer: Medicare Other | Source: Ambulatory Visit | Admitting: Orthopedic Surgery

## 2016-01-25 DIAGNOSIS — J95821 Acute postprocedural respiratory failure: Secondary | ICD-10-CM

## 2016-01-25 HISTORY — PX: POSTERIOR CERVICAL FUSION/FORAMINOTOMY: SHX5038

## 2016-01-25 LAB — BASIC METABOLIC PANEL
ANION GAP: 9 (ref 5–15)
BUN: 9 mg/dL (ref 6–20)
CALCIUM: 9 mg/dL (ref 8.9–10.3)
CO2: 22 mmol/L (ref 22–32)
Chloride: 107 mmol/L (ref 101–111)
Creatinine, Ser: 0.86 mg/dL (ref 0.61–1.24)
Glucose, Bld: 128 mg/dL — ABNORMAL HIGH (ref 65–99)
Potassium: 3.8 mmol/L (ref 3.5–5.1)
SODIUM: 138 mmol/L (ref 135–145)

## 2016-01-25 LAB — CBC
HCT: 35.4 % — ABNORMAL LOW (ref 39.0–52.0)
HEMOGLOBIN: 12.1 g/dL — AB (ref 13.0–17.0)
MCH: 33.2 pg (ref 26.0–34.0)
MCHC: 34.2 g/dL (ref 30.0–36.0)
MCV: 97.3 fL (ref 78.0–100.0)
PLATELETS: 124 10*3/uL — AB (ref 150–400)
RBC: 3.64 MIL/uL — ABNORMAL LOW (ref 4.22–5.81)
RDW: 14.1 % (ref 11.5–15.5)
WBC: 9.7 10*3/uL (ref 4.0–10.5)

## 2016-01-25 LAB — GLUCOSE, CAPILLARY
GLUCOSE-CAPILLARY: 150 mg/dL — AB (ref 65–99)
GLUCOSE-CAPILLARY: 152 mg/dL — AB (ref 65–99)
Glucose-Capillary: 133 mg/dL — ABNORMAL HIGH (ref 65–99)

## 2016-01-25 LAB — TRIGLYCERIDES: TRIGLYCERIDES: 751 mg/dL — AB (ref <150)

## 2016-01-25 SURGERY — POSTERIOR CERVICAL FUSION/FORAMINOTOMY LEVEL 4
Anesthesia: General

## 2016-01-25 MED ORDER — HYDROMORPHONE HCL 1 MG/ML IJ SOLN: 0.2500 mg | INTRAMUSCULAR | Status: DC | PRN

## 2016-01-25 MED ORDER — MIDAZOLAM HCL 2 MG/2ML IJ SOLN
INTRAMUSCULAR | Status: AC
  Filled 2016-01-25: qty 2

## 2016-01-25 MED ORDER — PHENYLEPHRINE HCL 10 MG/ML IJ SOLN
INTRAMUSCULAR | Status: DC | PRN
Start: 2016-01-25 — End: 2016-01-25
  Administered 2016-01-25 (×2): 80 ug via INTRAVENOUS

## 2016-01-25 MED ORDER — SUGAMMADEX SODIUM 200 MG/2ML IV SOLN
INTRAVENOUS | Status: DC | PRN
  Administered 2016-01-25: 200 mg via INTRAVENOUS

## 2016-01-25 MED ORDER — FENTANYL CITRATE (PF) 100 MCG/2ML IJ SOLN
INTRAMUSCULAR | Status: DC | PRN
Start: 2016-01-25 — End: 2016-01-25
  Administered 2016-01-25: 50 ug via INTRAVENOUS
  Administered 2016-01-25 (×2): 100 ug via INTRAVENOUS
  Administered 2016-01-25: 50 ug via INTRAVENOUS

## 2016-01-25 MED ORDER — DEXAMETHASONE SODIUM PHOSPHATE 10 MG/ML IJ SOLN
INTRAMUSCULAR | Status: DC | PRN
  Administered 2016-01-25: 10 mg via INTRAVENOUS

## 2016-01-25 MED ORDER — DEXTROSE 5 % IV SOLN
INTRAVENOUS | Status: DC | PRN
  Administered 2016-01-25: 08:00:00 via INTRAVENOUS

## 2016-01-25 MED ORDER — ROCURONIUM BROMIDE 50 MG/5ML IV SOSY
PREFILLED_SYRINGE | INTRAVENOUS | Status: AC
  Filled 2016-01-25: qty 5

## 2016-01-25 MED ORDER — SUCCINYLCHOLINE CHLORIDE 20 MG/ML IJ SOLN
INTRAMUSCULAR | Status: DC | PRN
  Administered 2016-01-25 (×2): 100 mg via INTRAVENOUS

## 2016-01-25 MED ORDER — ARTIFICIAL TEARS OP OINT
TOPICAL_OINTMENT | OPHTHALMIC | Status: AC
  Filled 2016-01-25: qty 7

## 2016-01-25 MED ORDER — LIDOCAINE 2% (20 MG/ML) 5 ML SYRINGE
INTRAMUSCULAR | Status: AC
  Filled 2016-01-25: qty 10

## 2016-01-25 MED ORDER — FENTANYL CITRATE (PF) 100 MCG/2ML IJ SOLN: 50.0000 ug | INTRAMUSCULAR | Status: DC | PRN

## 2016-01-25 MED ORDER — ALBUMIN HUMAN 5 % IV SOLN
INTRAVENOUS | Status: DC | PRN
  Administered 2016-01-25 (×2): via INTRAVENOUS

## 2016-01-25 MED ORDER — PROMETHAZINE HCL 25 MG/ML IJ SOLN: 6.2500 mg | INTRAMUSCULAR | Status: DC | PRN

## 2016-01-25 MED ORDER — ONDANSETRON HCL 4 MG/2ML IJ SOLN
INTRAMUSCULAR | Status: DC | PRN
  Administered 2016-01-25: 4 mg via INTRAVENOUS

## 2016-01-25 MED ORDER — BUPIVACAINE LIPOSOME 1.3 % IJ SUSP
INTRAMUSCULAR | Status: DC | PRN
  Administered 2016-01-25: 20 mL

## 2016-01-25 MED ORDER — SUCCINYLCHOLINE CHLORIDE 200 MG/10ML IV SOSY
PREFILLED_SYRINGE | INTRAVENOUS | Status: AC
  Filled 2016-01-25: qty 10

## 2016-01-25 MED ORDER — ARTIFICIAL TEARS OP OINT
TOPICAL_OINTMENT | OPHTHALMIC | Status: DC | PRN
  Administered 2016-01-25: 1 via OPHTHALMIC

## 2016-01-25 MED ORDER — EPHEDRINE 5 MG/ML INJ
INTRAVENOUS | Status: AC
  Filled 2016-01-25: qty 10

## 2016-01-25 MED ORDER — ONDANSETRON HCL 4 MG/2ML IJ SOLN
INTRAMUSCULAR | Status: AC
  Filled 2016-01-25: qty 2

## 2016-01-25 MED ORDER — LIDOCAINE HCL (CARDIAC) 20 MG/ML IV SOLN
INTRAVENOUS | Status: DC | PRN
  Administered 2016-01-25: 60 mg via INTRAVENOUS

## 2016-01-25 MED ORDER — VANCOMYCIN HCL 10 G IV SOLR
1250.0000 mg | Freq: Once | INTRAVENOUS | Status: DC
  Filled 2016-01-25: qty 1250

## 2016-01-25 MED ORDER — BUPIVACAINE LIPOSOME 1.3 % IJ SUSP
20.0000 mL | INTRAMUSCULAR | Status: DC
  Filled 2016-01-25: qty 20

## 2016-01-25 MED ORDER — PROPOFOL 10 MG/ML IV BOLUS
INTRAVENOUS | Status: DC | PRN
  Administered 2016-01-25: 100 mg via INTRAVENOUS
  Administered 2016-01-25: 50 mg via INTRAVENOUS
  Administered 2016-01-25: 150 mg via INTRAVENOUS

## 2016-01-25 MED ORDER — ALBUMIN HUMAN 5 % IV SOLN
INTRAVENOUS | Status: AC
  Administered 2016-01-25: 12.5 g via INTRAVENOUS
  Filled 2016-01-25: qty 250

## 2016-01-25 MED ORDER — HEMOSTATIC AGENTS (NO CHARGE) OPTIME
TOPICAL | Status: DC | PRN
  Administered 2016-01-25: 1 via TOPICAL

## 2016-01-25 MED ORDER — PROPOFOL 1000 MG/100ML IV EMUL
0.0000 ug/kg/min | INTRAVENOUS | Status: DC
  Administered 2016-01-25 (×2): 35 ug/kg/min via INTRAVENOUS
  Filled 2016-01-25 (×4): qty 100

## 2016-01-25 MED ORDER — VECURONIUM BROMIDE 10 MG IV SOLR
INTRAVENOUS | Status: DC | PRN
  Administered 2016-01-25: 10 mg via INTRAVENOUS

## 2016-01-25 MED ORDER — ALBUMIN HUMAN 5 % IV SOLN
12.5000 g | Freq: Once | INTRAVENOUS | Status: AC
  Administered 2016-01-25: 12.5 g via INTRAVENOUS

## 2016-01-25 MED ORDER — FENTANYL CITRATE (PF) 100 MCG/2ML IJ SOLN
50.0000 ug | INTRAMUSCULAR | Status: DC | PRN
  Administered 2016-01-26 – 2016-01-27 (×3): 50 ug via INTRAVENOUS
  Administered 2016-01-27: 25 ug via INTRAVENOUS
  Administered 2016-01-28: 50 ug via INTRAVENOUS
  Filled 2016-01-25 (×5): qty 2

## 2016-01-25 MED ORDER — BUPIVACAINE-EPINEPHRINE 0.25% -1:200000 IJ SOLN
INTRAMUSCULAR | Status: DC | PRN
  Administered 2016-01-25: 10 mL

## 2016-01-25 MED ORDER — 0.9 % SODIUM CHLORIDE (POUR BTL) OPTIME
TOPICAL | Status: DC | PRN
  Administered 2016-01-25 (×4): 1000 mL

## 2016-01-25 MED ORDER — BACITRACIN ZINC 500 UNIT/GM EX OINT
TOPICAL_OINTMENT | CUTANEOUS | Status: DC | PRN
  Administered 2016-01-25: 1 via TOPICAL

## 2016-01-25 MED ORDER — INSULIN ASPART 100 UNIT/ML ~~LOC~~ SOLN
2.0000 [IU] | SUBCUTANEOUS | Status: DC
  Administered 2016-01-25: 2 [IU] via SUBCUTANEOUS
  Administered 2016-01-25 – 2016-01-26 (×4): 4 [IU] via SUBCUTANEOUS
  Administered 2016-01-26: 2 [IU] via SUBCUTANEOUS
  Administered 2016-01-26: 4 [IU] via SUBCUTANEOUS
  Administered 2016-01-26: 2 [IU] via SUBCUTANEOUS
  Administered 2016-01-27: 4 [IU] via SUBCUTANEOUS
  Administered 2016-01-27 – 2016-01-28 (×4): 2 [IU] via SUBCUTANEOUS
  Administered 2016-01-28 (×2): 4 [IU] via SUBCUTANEOUS
  Administered 2016-01-29: 2 [IU] via SUBCUTANEOUS
  Administered 2016-01-29: 6 [IU] via SUBCUTANEOUS
  Administered 2016-01-29: 2 [IU] via SUBCUTANEOUS

## 2016-01-25 MED ORDER — ORAL CARE MOUTH RINSE
15.0000 mL | OROMUCOSAL | Status: DC
  Administered 2016-01-26 – 2016-01-27 (×15): 15 mL via OROMUCOSAL

## 2016-01-25 MED ORDER — THROMBIN 20000 UNITS EX KIT
PACK | CUTANEOUS | Status: DC | PRN
  Administered 2016-01-25: 20000 [IU] via TOPICAL

## 2016-01-25 MED ORDER — PHENYLEPHRINE HCL 10 MG/ML IJ SOLN
INTRAVENOUS | Status: DC | PRN
  Administered 2016-01-25: 10 ug/min via INTRAVENOUS

## 2016-01-25 MED ORDER — PHENYLEPHRINE 40 MCG/ML (10ML) SYRINGE FOR IV PUSH (FOR BLOOD PRESSURE SUPPORT)
PREFILLED_SYRINGE | INTRAVENOUS | Status: AC
  Filled 2016-01-25: qty 10

## 2016-01-25 MED ORDER — SUGAMMADEX SODIUM 200 MG/2ML IV SOLN
INTRAVENOUS | Status: AC
  Filled 2016-01-25: qty 2

## 2016-01-25 MED ORDER — FENOFIBRATE 160 MG PO TABS
160.0000 mg | ORAL_TABLET | Freq: Every day | ORAL | Status: DC
  Administered 2016-01-28 – 2016-01-30 (×3): 160 mg
  Filled 2016-01-25 (×5): qty 1

## 2016-01-25 MED ORDER — VANCOMYCIN HCL IN DEXTROSE 1-5 GM/200ML-% IV SOLN
1000.0000 mg | Freq: Once | INTRAVENOUS | Status: AC
  Administered 2016-01-25: 1000 mg via INTRAVENOUS
  Filled 2016-01-25: qty 200

## 2016-01-25 MED ORDER — PRAVASTATIN SODIUM 40 MG PO TABS
40.0000 mg | ORAL_TABLET | Freq: Every day | ORAL | Status: DC
  Administered 2016-01-27 – 2016-01-29 (×3): 40 mg
  Filled 2016-01-25 (×4): qty 1

## 2016-01-25 MED ORDER — CHLORHEXIDINE GLUCONATE 0.12% ORAL RINSE (MEDLINE KIT)
15.0000 mL | Freq: Two times a day (BID) | OROMUCOSAL | Status: DC
  Administered 2016-01-25 – 2016-01-27 (×4): 15 mL via OROMUCOSAL

## 2016-01-25 MED ORDER — DEXTROSE-NACL 5-0.45 % IV SOLN
INTRAVENOUS | Status: DC
  Administered 2016-01-25 – 2016-01-28 (×3): via INTRAVENOUS

## 2016-01-25 MED ORDER — FENTANYL CITRATE (PF) 100 MCG/2ML IJ SOLN
INTRAMUSCULAR | Status: AC
  Filled 2016-01-25: qty 6

## 2016-01-25 MED ORDER — DEXAMETHASONE SODIUM PHOSPHATE 10 MG/ML IJ SOLN
10.0000 mg | Freq: Once | INTRAMUSCULAR | Status: AC
  Administered 2016-01-25: 10 mg via INTRAVENOUS
  Filled 2016-01-25: qty 1

## 2016-01-25 MED ORDER — LACTATED RINGERS IV SOLN
INTRAVENOUS | Status: DC | PRN
  Administered 2016-01-25 (×2): via INTRAVENOUS

## 2016-01-25 MED ORDER — PROPOFOL 500 MG/50ML IV EMUL
INTRAVENOUS | Status: DC | PRN
  Administered 2016-01-25: 50 ug/kg/min via INTRAVENOUS

## 2016-01-25 MED ORDER — ALBUTEROL SULFATE (2.5 MG/3ML) 0.083% IN NEBU: 3.0000 mL | INHALATION_SOLUTION | RESPIRATORY_TRACT | Status: DC | PRN

## 2016-01-25 MED ORDER — PANTOPRAZOLE SODIUM 40 MG IV SOLR
40.0000 mg | INTRAVENOUS | Status: DC
  Administered 2016-01-25 – 2016-01-27 (×3): 40 mg via INTRAVENOUS
  Filled 2016-01-25 (×3): qty 40

## 2016-01-25 MED ORDER — DEXMEDETOMIDINE HCL IN NACL 200 MCG/50ML IV SOLN
0.4000 ug/kg/h | INTRAVENOUS | Status: DC
  Administered 2016-01-25: 0.4 ug/kg/h via INTRAVENOUS
  Administered 2016-01-26 (×2): 1.2 ug/kg/h via INTRAVENOUS
  Administered 2016-01-26 (×3): 1 ug/kg/h via INTRAVENOUS
  Administered 2016-01-26: 1.2 ug/kg/h via INTRAVENOUS
  Administered 2016-01-26: 0.4 ug/kg/h via INTRAVENOUS
  Administered 2016-01-26 – 2016-01-27 (×2): 1.2 ug/kg/h via INTRAVENOUS
  Administered 2016-01-27: 1 ug/kg/h via INTRAVENOUS
  Administered 2016-01-27 (×2): 1.2 ug/kg/h via INTRAVENOUS
  Filled 2016-01-25 (×15): qty 50

## 2016-01-25 SURGICAL SUPPLY — 85 items
APL SKNCLS STERI-STRIP NONHPOA (GAUZE/BANDAGES/DRESSINGS) ×1
BENZOIN TINCTURE PRP APPL 2/3 (GAUZE/BANDAGES/DRESSINGS) ×4 IMPLANT
BIT DRILL MOUNTAINEER 16MM (BIT) IMPLANT
BIT DRILL MOUNTAINER FXED 12MM (DRILL) IMPLANT
BLADE CLIPPER SURG NEURO (BLADE) IMPLANT
BONE VIVIGEN FORMABLE 5.4CC (Bone Implant) ×3 IMPLANT
BUR NEURO DRILL SOFT 3.0X3.8M (BURR) ×3 IMPLANT
BUR PRESCISION 1.7 ELITE (BURR) ×3 IMPLANT
CLOSURE WOUND 1/2 X4 (GAUZE/BANDAGES/DRESSINGS) ×1
CONT SPEC STER OR (MISCELLANEOUS) ×1 IMPLANT
CORDS BIPOLAR (ELECTRODE) ×3 IMPLANT
COVER SURGICAL LIGHT HANDLE (MISCELLANEOUS) ×3 IMPLANT
DRAIN CHANNEL 10F 3/8 F FF (DRAIN) IMPLANT
DRAIN CHANNEL 15F RND FF W/TCR (WOUND CARE) IMPLANT
DRAIN HEMOVAC 1/8 X 5 (WOUND CARE) IMPLANT
DRAPE C-ARM 42X72 X-RAY (DRAPES) ×2 IMPLANT
DRAPE INCISE IOBAN 66X45 STRL (DRAPES) ×3 IMPLANT
DRAPE PED LAPAROTOMY (DRAPES) ×3 IMPLANT
DRAPE POUCH INSTRU U-SHP 10X18 (DRAPES) ×3 IMPLANT
DRAPE PROXIMA HALF (DRAPES) ×7 IMPLANT
DRAPE SURG 17X23 STRL (DRAPES) ×24 IMPLANT
DRAPE TABLE COVER HEAVY DUTY (DRAPES) ×3 IMPLANT
DRILL BIT MOUNTAINEER 16MM (BIT) ×3
DRILL MOUNTAINEER FIXED 12MM (DRILL) ×3
DRSG MEPILEX BORDER 4X8 (GAUZE/BANDAGES/DRESSINGS) ×3 IMPLANT
DURAPREP 26ML APPLICATOR (WOUND CARE) ×3 IMPLANT
ELECT BLADE 4.0 EZ CLEAN MEGAD (MISCELLANEOUS) ×3
ELECT CAUTERY BLADE 6.4 (BLADE) ×3 IMPLANT
ELECT REM PT RETURN 9FT ADLT (ELECTROSURGICAL) ×3
ELECTRODE BLDE 4.0 EZ CLN MEGD (MISCELLANEOUS) ×1 IMPLANT
ELECTRODE REM PT RTRN 9FT ADLT (ELECTROSURGICAL) ×1 IMPLANT
EVACUATOR SILICONE 100CC (DRAIN) IMPLANT
GAUZE SPONGE 4X4 12PLY STRL (GAUZE/BANDAGES/DRESSINGS) ×3 IMPLANT
GAUZE SPONGE 4X4 16PLY XRAY LF (GAUZE/BANDAGES/DRESSINGS) ×8 IMPLANT
GLOVE BIO SURGEON STRL SZ7 (GLOVE) ×3 IMPLANT
GLOVE BIO SURGEON STRL SZ8 (GLOVE) ×3 IMPLANT
GLOVE BIOGEL PI IND STRL 7.5 (GLOVE) ×1 IMPLANT
GLOVE BIOGEL PI IND STRL 8 (GLOVE) ×1 IMPLANT
GLOVE BIOGEL PI INDICATOR 7.5 (GLOVE) ×2
GLOVE BIOGEL PI INDICATOR 8 (GLOVE) ×2
GOWN STRL REUS W/ TWL LRG LVL3 (GOWN DISPOSABLE) ×4 IMPLANT
GOWN STRL REUS W/ TWL XL LVL3 (GOWN DISPOSABLE) ×1 IMPLANT
GOWN STRL REUS W/TWL LRG LVL3 (GOWN DISPOSABLE) ×12
GOWN STRL REUS W/TWL XL LVL3 (GOWN DISPOSABLE) ×3
GRAFT BNE MATRIX VG FRMBL MD 5 (Bone Implant) IMPLANT
IV CATH 14GX2 1/4 (CATHETERS) ×3 IMPLANT
KIT BASIN OR (CUSTOM PROCEDURE TRAY) ×3 IMPLANT
KIT ROOM TURNOVER OR (KITS) ×3 IMPLANT
NDL HYPO 25GX1X1/2 BEV (NEEDLE) ×1 IMPLANT
NDL PRECISIONGLIDE 27X1.5 (NEEDLE) ×1 IMPLANT
NEEDLE HYPO 25GX1X1/2 BEV (NEEDLE) ×3 IMPLANT
NEEDLE PRECISIONGLIDE 27X1.5 (NEEDLE) ×3 IMPLANT
NS IRRIG 1000ML POUR BTL (IV SOLUTION) ×9 IMPLANT
PACK LAMINECTOMY ORTHO (CUSTOM PROCEDURE TRAY) ×3 IMPLANT
PAD ARMBOARD 7.5X6 YLW CONV (MISCELLANEOUS) ×6 IMPLANT
PATTIES SURGICAL .5 X.5 (GAUZE/BANDAGES/DRESSINGS) ×3 IMPLANT
PATTIES SURGICAL .5 X3 (DISPOSABLE) IMPLANT
PATTIES SURGICAL .5X1.5 (GAUZE/BANDAGES/DRESSINGS) ×3 IMPLANT
PIN MAYFIELD SKULL DISP (PIN) ×3 IMPLANT
PUTTY DBX 1CC (Putty) ×3 IMPLANT
PUTTY DBX 1CC DEPUY (Putty) IMPLANT
ROD MOUTAINEER 3.5X200MM (Rod) ×2 IMPLANT
ROD TEMPLATE MOUNTAINEER 120MM (Rod) ×2 IMPLANT
SCREW ANGLE MT FAVORED 4.0X24M (Screw) ×4 IMPLANT
SCREW F A 3.5X14 (Screw) ×12 IMPLANT
SCREW INNER (Screw) ×16 IMPLANT
SPONGE INTESTINAL PEANUT (DISPOSABLE) IMPLANT
SPONGE SURGIFOAM ABS GEL 100 (HEMOSTASIS) ×3 IMPLANT
STRIP CLOSURE SKIN 1/2X4 (GAUZE/BANDAGES/DRESSINGS) ×1 IMPLANT
SURGIFLO W/THROMBIN 8M KIT (HEMOSTASIS) IMPLANT
SUT MNCRL AB 4-0 PS2 18 (SUTURE) ×5 IMPLANT
SUT VIC AB 0 CT1 18XCR BRD 8 (SUTURE) ×1 IMPLANT
SUT VIC AB 0 CT1 8-18 (SUTURE) ×3
SUT VIC AB 1 CT1 18XCR BRD 8 (SUTURE) ×2 IMPLANT
SUT VIC AB 1 CT1 8-18 (SUTURE) ×9
SUT VIC AB 2-0 CT2 18 VCP726D (SUTURE) ×5 IMPLANT
SYR BULB IRRIGATION 50ML (SYRINGE) ×3 IMPLANT
SYR CONTROL 10ML LL (SYRINGE) ×3 IMPLANT
TAPE CLOTH 4X10 WHT NS (GAUZE/BANDAGES/DRESSINGS) ×3 IMPLANT
TAPE CLOTH SURG 4X10 WHT LF (GAUZE/BANDAGES/DRESSINGS) ×2 IMPLANT
TOWEL OR 17X24 6PK STRL BLUE (TOWEL DISPOSABLE) ×3 IMPLANT
TOWEL OR 17X26 10 PK STRL BLUE (TOWEL DISPOSABLE) ×3 IMPLANT
TRAY FOLEY CATH 16FRSI W/METER (SET/KITS/TRAYS/PACK) ×3 IMPLANT
WATER STERILE IRR 1000ML POUR (IV SOLUTION) ×3 IMPLANT
YANKAUER SUCT BULB TIP NO VENT (SUCTIONS) ×3 IMPLANT

## 2016-01-25 NOTE — Progress Notes (Signed)
eLink Physician-Brief Progress Note Patient Name: [name] DOB: 1933/03/07 MRN: [id number]   Date of Service  01/25/2016  HPI/Events of Note  Triglyceride level = 751. Patient currently on a Propofol IV infusion for sedation.   eICU Interventions  Will order: 1. D/C Propofol IV infusion. 2. Precedex IV infusion. Titrate to RASS = 0.     Intervention Category Intermediate Interventions: Diagnostic test evaluation  Lysle Dingwall 01/25/2016, 9:49 PM

## 2016-01-25 NOTE — Care Management Note (Signed)
Case Management Note  Patient Details  Name: [name] MRN: [id number] Date of Birth: [birth year]  Subjective/Objective:   Pt admitted on 01/24/16 for 2 stage spinal surgery.  PTA, pt resided at home.                  Action/Plan: Appears arrangements are in place for pt to dc to [facility] on Saturday, 01/27/16.  Will have CSW follow up to facilitate timely dc.    Expected Discharge Date:  01/27/16               Expected Discharge Plan:  Skilled Nursing Facility  In-House Referral:  Clinical Social Work  Discharge planning Services  CM Consult  Post Acute Care Choice:    Choice offered to:     DME Arranged:    DME Agency:     HH Arranged:    [place] Agency:     Status of Service:  In process, will continue to follow  If discussed at Long Length of Stay Meetings, dates discussed:    Additional Comments:  Reinaldo Raddle, RN, BSN  Trauma/Neuro ICU Case Manager [phone]

## 2016-01-25 NOTE — Anesthesia Procedure Notes (Addendum)
Procedure Name: Intubation Date/Time: 01/25/2016 7:33 AM Performed by: Jacquiline Doe A Pre-anesthesia Checklist: Patient identified, Emergency Drugs available, Suction available and Patient being monitored Patient Re-evaluated:Patient Re-evaluated prior to inductionOxygen Delivery Method: Circle System Utilized and Circle system utilized Preoxygenation: Pre-oxygenation with 100% oxygen Intubation Type: IV induction Ventilation: Mask ventilation without difficulty and Oral airway inserted - appropriate to patient size Laryngoscope Size: Mac, 4 and Glidescope Grade View: Grade I Tube type: Oral Tube size: 7.0 mm Number of attempts: 1 Airway Equipment and Method: Rigid stylet and Video-laryngoscopy Placement Confirmation: ETT inserted through vocal cords under direct vision,  positive ETCO2 and breath sounds checked- equal and bilateral Secured at: 23 cm Tube secured with: Tape Dental Injury: Teeth and Oropharynx as per pre-operative assessment

## 2016-01-25 NOTE — H&P (Cosign Needed)
Patient tolerated stage 1 of his 2-staged procedure yesterday well, and presents today for stage 2 (PCDF C3-C7). Will proceed as planned.

## 2016-01-25 NOTE — Transfer of Care (Addendum)
Immediate Anesthesia Transfer of Care Note  Patient: [name]  Procedure(s) Performed: Procedure(s) with comments: POSTERIOR CERVICAL DECOMPRESSION FUSION, CERVICAL 3-4, CERVICAL 4-5, CERVICAL 5-6, CERVICAL 6-7 WITH INSTRUMENTATION AND ALLOGRAFT (N/A) - POSTERIOR CERVICAL DECOMPRESSION FUSION, CERVICAL 3-4, CERVICAL 4-5, CERVICAL 5-6, CERVICAL 6-7 WITH INSTRUMENTATION AND ALLOGRAFT  Patient Location: PACU  Anesthesia Type:General  Level of Consciousness:   Airway & Oxygen Therapy: Patient remains intubated per anesthesia plan and Patient placed on Ventilator (see vital sign flow sheet for setting) the patient reintubated prior to rec room .  Post-op Assessment: Report given to RN  Post vital signs: Reviewed and stable  Last Vitals:  Vitals:   01/25/16 0610 01/25/16 0617  BP:    Pulse:  91  Resp: 18 14  Temp:      Last Pain:  Vitals:   01/25/16 0500  TempSrc: Oral  PainSc:       Patients Stated Pain Goal: 3 (35/46/56 8127)  Complications: No apparent anesthesia complications

## 2016-01-25 NOTE — Progress Notes (Signed)
Pharmacy Antibiotic Note  [name] is a 83 y.o. male admitted on 01/24/2016 with surgical prophylaxis.  Pharmacy has been consulted for vancomycin dosing.  Patient does not have a drain.  Patient was in surgery yesterday and received vancomycin 1gm IV around 0830 and then another dose post-op around midnight.  He received vancomycin '1250mg'$  IV pre-op today at 0800.  His renal function improved and his estimated CrCL is ~75 ml/min.   Plan: - Vanc '1000mg'$  IV x 1 at [place] will sign off.  Thank you for the consult!   Height: 6' (182.9 cm) Weight: 182 lb 1.6 oz (82.6 kg) IBW/kg (Calculated) : 77.6  Temp (24hrs), Avg:98.1 F (36.7 C), Min:97.7 F (36.5 C), Max:98.4 F (36.9 C)  No results for input(s): WBC, CREATININE, LATICACIDVEN, VANCOTROUGH, VANCOPEAK, VANCORANDOM, GENTTROUGH, GENTPEAK, GENTRANDOM, TOBRATROUGH, TOBRAPEAK, TOBRARND, AMIKACINPEAK, AMIKACINTROU, AMIKACIN in the last 168 hours.  Estimated Creatinine Clearance: 50.5 mL/min (by C-G formula based on SCr of 1.28 mg/dL (H)).    Allergies  Allergen Reactions  . Penicillins Rash    Has patient had a PCN reaction causing immediate rash, facial/tongue/throat swelling, SOB or lightheadedness with hypotension:unsure Has patient had a PCN reaction causing severe rash involving mucus membranes or skin necrosis:unsure Has patient had a PCN reaction that required hospitalization:No Has patient had a PCN reaction occurring within the last 10 years:Yes If all of the above answers are "NO", then may proceed with Cephalosporin use.      Gee Habig D. Mina Marble, PharmD, BCPS Pager:  [phone] 01/25/2016, 5:11 PM

## 2016-01-25 NOTE — Op Note (Signed)
[name], [name] NO.:  [id number]  MEDICAL RECORD NO.:  [id number]  LOCATION:                                 FACILITY:  PHYSICIAN:  Phylliss Bob, MD      DATE OF BIRTH:  01/03/1934  DATE OF PROCEDURE:  01/24/2016                              OPERATIVE REPORT   PREOPERATIVE DIAGNOSIS: 1. Progressive cervical myelopathy. 2. Spinal cord compression spanning C3-7   POSTOPERATIVE DIAGNOSES: 1. Progressive cervical myelopathy. 2. Spinal cord compression spanning C3-7   PROCEDURE:  (stage 1 of 2) 1. Anterior cervical decompression and fusion C3-4. 2. Insertion of interbody device x1 (Titan 6-mm small lordotic     intervertebral spacer). 3. Placement of anterior instrumentation, C3-4. 4. Use of allograft-ViviGen. 5. Intraoperative use of fluoroscopy.  SURGEON:  Phylliss Bob, M.D.  ASSISTANT:  [name] Holm, PA-C.  ANESTHESIA:  General endotracheal anesthesia.  COMPLICATIONS:  None.  DISPOSITION:  Stable.  ESTIMATED BLOOD LOSS:  Minimal.  INDICATIONS FOR SURGERY:  Briefly, [name] is a pleasant 83 year old male who did present to me with ongoing and progressive cervical myelopathy.  The patient was initially evaluated by another surgeon.  He did see me as a second opinion, and I did recommend that the patient followup with his initial surgeon, but he did elect to have his definitive care provided by me.  We did specifically discuss proceeding with a two-stage procedure.  Specifically, with the procedure noted above on stage I, to be followed by a posterior cervical decompression and fusion spanning C3-C7 on the following day.  The patient was fully aware of the risks and limitations of surgery, including the risk of neurologic injury, and medical complications as well as vascular injury, etc.  The patient did specifically also clearly understand that the goal of his surgery was not to reverse his current constellation of symptoms, but to  prevent any additional deterioration.  The patient did elect to proceed with surgery.  OPERATIVE DETAILS:  On January 24, 2016, the patient was brought to surgery and general endotracheal anesthesia was administered.  The patient was placed supine on the hospital bed.  The patient's neck was maintained in the neutral alignment.  A glide scope was used by the Anesthesia team, in order to minimize any undue extension of his neck, given his progressive cervical myelopathy and spinal cord compression. Once intubated, the neck was prepped and draped and the patient's arms were secured to his sides.  A time-out procedure was performed.  A left- sided transverse incision was made, overlying the C3-4 intervertebral space.  The platysma was sharply incised.  The plane between the carotid artery and the esophagus was identified and developed and the anterior spine was readily noted.  Self-retaining retractors were placed.  The vertebral bodies of C3 and C4 were identified and subperiosteally exposed.  Caspar pins were then placed into the C3 and C4 vertebral bodies and distraction was applied across the C3-4 intervertebral space. I then proceeded with a standard C3-4 intervertebral diskectomy.  There were substantial osteophytes noted posteriorly associated with the C3 vertebral body, which were removed with a bur in addition to a #1 followed by #2 Kerrison.  There was clearly disk material and a disk osteophyte complex posteriorly, compressing the spinal canal, which was meticulously removed, thereby decompressing the spinal cord.  I was very pleased with the decompression.  The endplates were then prepared and a 6-mm trial was utilized and felt to be the appropriate height.  A 6-mm small lordotic intervertebral implant was packed with ViviGen and tamped into position in the usual fashion.  I was pleased with the lateral fluoroscopic image.  I then chose a 12-mm Skyline anterior cervical plate,  which was placed over the anterior spine.  14-mm screws were placed, 2 in each vertebral body at C3 and C4 for a total of 4 vertebral body screws.  The screws were then locked to the plate using the Cam locking mechanism.  An additional lateral intraoperative fluoroscopic image was obtained, I was pleased with the appearance.  The wound was then irrigated.  The wound was then closed in layers.  The platysma was closed using 2-0 Vicryl, and the skin was closed using 4-0 Monocryl. Benzoin and Steri-Strips were applied followed by sterile dressing.  All instrument counts were correct at the termination of the procedure.  Of note, [name] Holm, PA-C, was my assistant throughout surgery, and did aid in retraction, suctioning, and closure from start to finish.     Phylliss Bob, MD   ______________________________ Phylliss Bob, MD    MD/MEDQ  D:  01/24/2016  T:  01/25/2016  Job:  [id number]

## 2016-01-25 NOTE — Progress Notes (Signed)
Patient continually c/o of difficulty breathing. Patient O2 sat 95% or greater. Patient unable to clear secretions following incentive spirometry, bilateral fine crackles with inspiration auscultated. NS paused at 0250. Patient lungs clear bilaterally prior to transporting to pre-op at 0620. Report given to transporting RN.

## 2016-01-25 NOTE — Progress Notes (Signed)
Patient c/o difficulty catching breath and aspen collar tightness. RN  surgical site assessed, no swelling or drainage. Aspen collar applied appropriately. Patient completed incentive spirometry, ambulated in hall, repositioned in bed, HOB 30 degrees.

## 2016-01-25 NOTE — Anesthesia Procedure Notes (Signed)
Procedure Name: Intubation Date/Time: 01/25/2016 1:18 PM Performed by: Jacquiline Doe A Pre-anesthesia Checklist: Patient identified, Emergency Drugs available, Suction available and Patient being monitored Patient Re-evaluated:Patient Re-evaluated prior to inductionOxygen Delivery Method: Circle System Utilized and Circle system utilized Preoxygenation: Pre-oxygenation with 100% oxygen Intubation Type: IV induction Ventilation: Mask ventilation without difficulty, Mask ventilation with difficulty, Two handed mask ventilation required, Nasal airway inserted- appropriate to patient size and Oral airway inserted - appropriate to patient size Laryngoscope Size: Mac, 4 and Glidescope Grade View: Grade I Tube type: Subglottic suction tube Number of attempts: 1 Airway Equipment and Method: Oral airway,  Video-laryngoscopy and Rigid stylet Placement Confirmation: ETT inserted through vocal cords under direct vision,  positive ETCO2 and breath sounds checked- equal and bilateral Secured at: 23 cm Tube secured with: Tape Dental Injury: Teeth and Oropharynx as per pre-operative assessment

## 2016-01-25 NOTE — Progress Notes (Signed)
eLink Physician-Brief Progress Note Patient Name: [name] DOB: 09-Jul-1933 MRN: [id number]   Date of Service  01/25/2016  HPI/Events of Note  Notified of need for stress ulcer prophylaxis.   eICU Interventions  Will order:  1. Protonix IV.      Intervention Category Intermediate Interventions: Best-practice therapies (e.g. DVT, beta blocker, etc.)  Lateria Alderman Eugene 01/25/2016, 7:35 PM

## 2016-01-25 NOTE — Consult Note (Signed)
PULMONARY / CRITICAL CARE MEDICINE   Name: [name] MRN: [id number] DOB: 10-19-1933    ADMISSION DATE:  01/24/2016 CONSULTATION DATE:  1/11  REFERRING MD:  Lynann Bologna (ortho)   CHIEF COMPLAINT:  Respiratory failure   HISTORY OF PRESENT ILLNESS:   83yo male with hx HTN, prostate cancer, chronic back pain admitted 1/10 for elective 2-stage cervical decompression and C3-C7 fusion.  Underwent first stage (anterior approach C3-C4) 1/10 without difficulty.  Returned to OR 1/11 for second phase (posterior C4-C7) with some c/o SOB.  Extubated in OR but required re-intubation in PACU and PCCM consulted for ICU admit and vent management.  Per Anesthesia report to Dr Chase Caller - vocal cord edema noticed by relatively stratighforward reintubation. On CCM arrival in PACU and sedated with diprivan .  SIGNIFICANT EVENTS: 01/10 > part 1 surgery, anterior decompression / fusion. 01/11 > part 2, posterior.  LINES/TUBES: ETT 1/11>>>   PAST MEDICAL HISTORY :  He  has a past medical history of Anxiety; Arthritis; Cancer ([place]); Chronic neck pain; Depression; History of colon polyps; History of vertigo; HOH (hard of hearing); Hyperlipidemia; Hypertension; Restless leg; Urinary frequency; Varicose veins; and Weakness.  PAST SURGICAL HISTORY: He  has a past surgical history that includes Prostate surgery; Hernia repair; Colonoscopy; Back surgery; Eye surgery (Bilateral); Inner ear surgery (Left); penile prothesis; Joint replacement (Left); Reverse shoulder arthroplasty (Left, 05/04/2015); Carpal tunnel release (Left, 2017); and Anterior cervical decomp/discectomy fusion (N/A, 01/24/2016).  Allergies  Allergen Reactions  . Penicillins Rash    Has patient had a PCN reaction causing immediate rash, facial/tongue/throat swelling, SOB or lightheadedness with hypotension:unsure Has patient had a PCN reaction causing severe rash involving mucus membranes or skin necrosis:unsure Has patient had a PCN reaction  that required hospitalization:No Has patient had a PCN reaction occurring within the last 10 years:Yes If all of the above answers are "NO", then may proceed with Cephalosporin use.     No current facility-administered medications on file prior to encounter.    Current Outpatient Prescriptions on File Prior to Encounter  Medication Sig  . albuterol (PROVENTIL HFA;VENTOLIN HFA) 108 (90 Base) MCG/ACT inhaler Inhale 1-2 puffs into the lungs every 6 (six) hours as needed (for shortness of breath/wheezing (scheduled at bedtime per patient)).   Marland Kitchen ALPRAZolam (XANAX) 0.25 MG tablet Take 0.25 mg by mouth at bedtime.   Marland Kitchen amLODipine (NORVASC) 5 MG tablet Take 5 mg by mouth daily.   . fenofibrate micronized (LOFIBRA) 200 MG capsule Take 200 mg by mouth daily before breakfast.  . labetalol (NORMODYNE) 200 MG tablet Take 200 mg by mouth 2 (two) times daily.  . pravastatin (PRAVACHOL) 40 MG tablet Take 40 mg by mouth at bedtime.     FAMILY HISTORY:  His indicated that his mother is deceased. He indicated that his father is deceased.    SOCIAL HISTORY: He  reports that he has quit smoking. His smokeless tobacco use includes Chew. He reports that he does not drink alcohol or use drugs.  REVIEW OF SYSTEMS:   Unable, pt sedated on vent. Obtained from records and RN/MD.   SUBJECTIVE:    VITAL SIGNS: BP 114/60   Pulse 93   Temp 98.3 F (36.8 C)   Resp 12   Ht 6' (1.829 m)   Wt 82.6 kg (182 lb 1.6 oz)   SpO2 92%   BMI 24.70 kg/m   HEMODYNAMICS:    VENTILATOR SETTINGS: Vent Mode: PRVC FiO2 (%):  [60 %-75 %] 60 % Set Rate:  [  18 bmp] 18 bmp Vt Set:  [610 mL] 610 mL PEEP:  [0 cmH20] 0 cmH20 Plateau Pressure:  [15 cmH20] 15 cmH20  INTAKE / OUTPUT: I/O last 3 completed shifts: In: 1228 [I.V.:1228] Out: 1250 [Urine:1250]  PHYSICAL EXAMINATION: General:  Sedated male . No distress.  Neuro:  RASS -3 on diprivan gtt HEENT:  C collar on . ET Tube +. No visible bleed Cardiovascular:   Normal heart sounds Lungs:  CTA bilaterally, Sync wituh vent. PRVC, 60%, peep 0, RR 24 Abdomen:  Soft, no mass Musculoskeletal:  No cyanosis. No clubbing.  Skin:  Intact in exposed areas  LABS: PULMONARY No results for input(s): PHART, PCO2ART, PO2ART, HCO3, TCO2, O2SAT in the last 168 hours.  Invalid input(s): PCO2, PO2  CBC  Recent Labs Lab 01/25/16 1636  HGB 12.1*  HCT 35.4*  WBC 9.7  PLT 124*    COAGULATION No results for input(s): INR in the last 168 hours.  CARDIAC  No results for input(s): TROPONINI in the last 168 hours. No results for input(s): PROBNP in the last 168 hours.   CHEMISTRY  Recent Labs Lab 01/25/16 1636  NA 138  K 3.8  CL 107  CO2 22  GLUCOSE 128*  BUN 9  CREATININE 0.86  CALCIUM 9.0   Estimated Creatinine Clearance: 75.2 mL/min (by C-G formula based on SCr of 0.86 mg/dL).   LIVER No results for input(s): AST, ALT, ALKPHOS, BILITOT, PROT, ALBUMIN, INR in the last 168 hours.   INFECTIOUS No results for input(s): LATICACIDVEN, PROCALCITON in the last 168 hours.   ENDOCRINE CBG (last 3)   Recent Labs  01/24/16 2124 01/25/16 0614  GLUCAP 178* 133*         IMAGING x48h  - image(s) personally visualized  -   highlighted in bold Dg Cervical Spine 1 View  Result Date: 01/25/2016 CLINICAL DATA:  Cervical spine fusion at C3-4, C4-5, and C5-6. EXAM: CERVICAL SPINE 1 VIEW COMPARISON:  CT cervical spine of 12/29/2015 FINDINGS: Cross-table lateral view of the cervical spine in the operating room labeled 1 shows anterior fusion at C3-4. Anterior metallic fixation plate is in good position with interbody fusion plug in good position. Alignment is normal. Clamps are noted on the posterior spinous processes of C4 and C5. IMPRESSION: Anterior fusion at C3-4.  No complicating features. Electronically Signed   By: Ivar Drape M.D.   On: 01/25/2016 13:50   Dg Cervical Spine 1 View  Result Date: 01/24/2016 CLINICAL DATA:  Cervical fusion.  EXAM: CERVICAL SPINE 1 VIEW COMPARISON:  None. FINDINGS: Lateral spot film of the cervical spine demonstrates anterior and interbody fusion changes CT C3-4. The hardware is in good position. No complicating features. IMPRESSION: C3-4 fusion. Electronically Signed   By: Marijo Sanes M.D.   On: 01/24/2016 10:35   Dg Cervical Spine 2-3 Views  Result Date: 01/25/2016 CLINICAL DATA:  Cervical fusion. EXAM: CERVICAL SPINE - 2-3 VIEW COMPARISON:  01/24/2016. FINDINGS: Posterior fusion from C3 through C7. Anterior and interbody fusion C3-C4. Hardware intact. Normal alignment. Two images obtained. 0 minutes 13 seconds fluoroscopy time. IMPRESSION: Postsurgical changes cervical spine. Electronically Signed   By: Marcello Moores  Register   On: 01/25/2016 12:52   Portable Chest Xray  Result Date: 01/25/2016 CLINICAL DATA:  Intubated patient during cervical spine surgery. EXAM: PORTABLE CHEST 1 VIEW COMPARISON:  Chest x-ray of January 16, 2015 FINDINGS: There are new fluffy alveolar opacities bilaterally. The pulmonary vascularity is engorged. The cardiac silhouette is normal in size. There  is no pneumothorax. There is no significant pleural effusion visible. There is calcification in the wall of the aortic arch. The endotracheal tube tip lies 4.4 cm above the carina. IMPRESSION: Bilateral alveolar opacities worrisome for pulmonary edema. Certainly pneumonia cannot be absolutely excluded. The endotracheal tube tip lies 4.4 cm above the carina. Thoracic aortic atherosclerosis. Electronically Signed   By: David  Martinique M.D.   On: 01/25/2016 16:06   Dg C-arm 1-60 Min  Result Date: 01/25/2016 CLINICAL DATA:  Posterior cervical disc fusion. EXAM: DG C-ARM 61-120 MIN FLUOROSCOPY TIME:  Routine seconds. COMPARISON:  January 24, 2016. FINDINGS: Two intraoperative fluoroscopic images were obtained of the cervical spine. These demonstrate the patient to be status post surgical anterior fusion of C3-4 with interbody fusion. The patient  is now status post surgical posterior fusion extending from C3-C7, with bilateral intrapedicular screw placement present at C3, C4, C5 and C7. Good alignment of vertebral bodies is noted. IMPRESSION: Status post surgical posterior fusion extending from C3-C7. Electronically Signed   By: Marijo Conception, M.D.   On: 01/25/2016 12:53   Dg C-arm 1-60 Min  Result Date: 01/24/2016 CLINICAL DATA:  Cervical fusion. EXAM: CERVICAL SPINE 1 VIEW COMPARISON:  None. FINDINGS: Lateral spot film of the cervical spine demonstrates anterior and interbody fusion changes CT C3-4. The hardware is in good position. No complicating features. IMPRESSION: C3-4 fusion. Electronically Signed   By: Marijo Sanes M.D.   On: 01/24/2016 10:35  DISCUSSION: 83yo male re-intubated post second phase of 2-phase (anterior and posterior approach) cervical decompression and fusion.     ASSESSMENT / PLAN:  NEUROLOGIC A:   Chemical sedation. Progressive cervical myelopathy with spinal cord compression - s/p 2 part surgery 01/24/16 and 01/25/16. Hx RLS, anxiety.  P:   Sedation:  Propofol gtt / Fentanyl PRN. RASS goal: 0 to -1. Daily WUA. Hold preadmission ropinirole, alprazolam.  PULMONARY A: VDRF - due to failed extubation post op.- vocal cord edema reported P:   Full vent support. Wean as able. VAP prevention measures. SBT in AM if able.- would need to check with Dr Lynann Bologna - and migh tneed airway exam via bronch or DL BD's. CXR in AM.  CARDIOVASCULAR A:  Hx HTN, HLD. P:  Continue preadmission pravastatin, fenofibrate. Hold preadmission amlodipine, labetalol, losartan.  RENAL A:   AKI. P:   d5 half NS @ 50 BMP in AM.  GASTROINTESTINAL A:   GI prophylaxis. Nutrition. P:   SUP: Pantoprazole. NPO.  HEMATOLOGIC / ONCOLOGIC A:   Mild thrombocytopenia. VTE Prophylaxis. Hx prostate CA. P:  Monitor platelet counts. SCD's / heparin. CBC in AM.  INFECTIOUS A:   No indication of  infection. P:   Monitor clinically.  ENDOCRINE A:   No acute issues.   P:   icu hyperglycemia phase 1   Family updated: none at bedside  Interdisciplinary Family Meeting v Palliative Care Meeting:  Due by: 1/18'/18   .  Rest per NP/medical resident whose note is outlined above and that I agree with  The patient is critically ill with multiple organ systems failure and requires high complexity decision making for assessment and support, frequent evaluation and titration of therapies, application of advanced monitoring technologies and extensive interpretation of multiple databases.   Critical Care Time devoted to patient care services described in this note is  30  Minutes. This time reflects time of care of this signee Dr Brand Males. This critical care time does not reflect procedure time, or teaching time  or supervisory time of PA/NP/Med student/Med Resident etc but could involve care discussion time    Dr. Brand Males, M.D., [facility].C.P Pulmonary and Critical Care Medicine Staff Physician [facility] Pager: [phone], If no answer or between  15:00h - 7:00h: call [phone]  01/25/2016 5:41 PM

## 2016-01-25 NOTE — Anesthesia Preprocedure Evaluation (Addendum)
Anesthesia Evaluation  Patient identified by MRN, date of birth, ID band Patient awake    Reviewed: Allergy & Precautions, H&P , NPO status , Patient's Chart, lab work & pertinent test results, reviewed documented beta blocker date and time   Airway Mallampati: II  TM Distance: >3 FB Neck ROM: Limited    Dental no notable dental hx. (+) Edentulous Upper, Edentulous Lower, Dental Advisory Given   Pulmonary neg pulmonary ROS, former smoker, PE   Pulmonary exam normal breath sounds clear to auscultation       Cardiovascular hypertension, Pt. on medications and Pt. on home beta blockers negative cardio ROS   Rhythm:regular Rate:Normal     Neuro/Psych Anxiety Depression  Neuromuscular disease negative neurological ROS  negative psych ROS   GI/Hepatic negative GI ROS, Neg liver ROS,   Endo/Other  negative endocrine ROS  Renal/GU negative Renal ROS  negative genitourinary   Musculoskeletal negative musculoskeletal ROS (+) Arthritis , Osteoarthritis,    Abdominal   Peds negative pediatric ROS (+)  Hematology negative hematology ROS (+)   Anesthesia Other Findings HTN Myleopathy  4-17:  Ventilation: Mask ventilation without difficulty and Oral airway inserted - appropriate to patient size Laryngoscope Size: Mac and 4 Grade View: Grade I Tube type: Oral Tube size: 7.5 mm Number of attempts: 1  Reproductive/Obstetrics negative OB ROS                             Lab Results  Component Value Date   WBC 5.9 01/16/2016   HGB 14.0 01/16/2016   HCT 40.0 01/16/2016   MCV 95.5 01/16/2016   PLT 131 (L) 01/16/2016   Lab Results  Component Value Date   CREATININE 1.28 (H) 01/16/2016   BUN 21 (H) 01/16/2016   NA 139 01/16/2016   K 3.6 01/16/2016   CL 106 01/16/2016   CO2 22 01/16/2016    Anesthesia Physical  Anesthesia Plan  ASA: II  Anesthesia Plan: General   Post-op Pain Management:     Induction: Intravenous  Airway Management Planned: Video Laryngoscope Planned  Additional Equipment:   Intra-op Plan:   Post-operative Plan: Extubation in OR  Informed Consent: I have reviewed the patients History and Physical, chart, labs and discussed the procedure including the risks, benefits and alternatives for the proposed anesthesia with the patient or authorized representative who has indicated his/her understanding and acceptance.   Dental Advisory Given and Dental advisory given  Plan Discussed with: CRNA, Surgeon and Anesthesiologist  Anesthesia Plan Comments: (  )        Anesthesia Quick Evaluation

## 2016-01-26 ENCOUNTER — Encounter (HOSPITAL_COMMUNITY): Payer: Self-pay | Admitting: Orthopedic Surgery

## 2016-01-26 ENCOUNTER — Inpatient Hospital Stay (HOSPITAL_COMMUNITY): Payer: Medicare Other

## 2016-01-26 LAB — GLUCOSE, CAPILLARY
GLUCOSE-CAPILLARY: 129 mg/dL — AB (ref 65–99)
GLUCOSE-CAPILLARY: 168 mg/dL — AB (ref 65–99)
GLUCOSE-CAPILLARY: 170 mg/dL — AB (ref 65–99)
Glucose-Capillary: 198 mg/dL — ABNORMAL HIGH (ref 65–99)
Glucose-Capillary: 152 mg/dL — ABNORMAL HIGH (ref 65–99)
Glucose-Capillary: 122 mg/dL — ABNORMAL HIGH (ref 65–99)
Glucose-Capillary: 153 mg/dL — ABNORMAL HIGH (ref 65–99)

## 2016-01-26 LAB — CBC
HCT: 36.2 % — ABNORMAL LOW (ref 39.0–52.0)
HEMOGLOBIN: 12.7 g/dL — AB (ref 13.0–17.0)
MCH: 33.7 pg (ref 26.0–34.0)
MCHC: 35.1 g/dL (ref 30.0–36.0)
MCV: 96 fL (ref 78.0–100.0)
Platelets: 107 10*3/uL — ABNORMAL LOW (ref 150–400)
RBC: 3.77 MIL/uL — AB (ref 4.22–5.81)
RDW: 13.6 % (ref 11.5–15.5)
WBC: 11.5 10*3/uL — ABNORMAL HIGH (ref 4.0–10.5)

## 2016-01-26 LAB — BASIC METABOLIC PANEL
ANION GAP: 13 (ref 5–15)
BUN: 8 mg/dL (ref 6–20)
CHLORIDE: 105 mmol/L (ref 101–111)
CO2: 23 mmol/L (ref 22–32)
Calcium: 9.2 mg/dL (ref 8.9–10.3)
Creatinine, Ser: 0.88 mg/dL (ref 0.61–1.24)
GFR calc non Af Amer: >60 mL/min (ref >60)
Glucose, Bld: 187 mg/dL — ABNORMAL HIGH (ref 65–99)
Potassium: 3.4 mmol/L — ABNORMAL LOW (ref 3.5–5.1)
Sodium: 141 mmol/L (ref 135–145)

## 2016-01-26 LAB — PHOSPHORUS: Phosphorus: 2.5 mg/dL (ref 2.5–4.6)

## 2016-01-26 LAB — MAGNESIUM: Magnesium: 1.7 mg/dL (ref 1.7–2.4)

## 2016-01-26 MED FILL — Sodium Chloride IV Soln 0.9%: INTRAVENOUS | Qty: 1000 | Status: AC

## 2016-01-26 MED FILL — Heparin Sodium (Porcine) Inj 1000 Unit/ML: INTRAMUSCULAR | Qty: 30 | Status: AC

## 2016-01-26 NOTE — Addendum Note (Signed)
Addendum  created 01/26/16 1812 by Belinda Block, MD   Sign clinical note

## 2016-01-26 NOTE — Progress Notes (Signed)
Report given to Amy RN at this time.  Pt has no s/s of any acute distress or pain.

## 2016-01-26 NOTE — Progress Notes (Signed)
Patient Intubated. Very alert, and appears comfortable. Able to communicate with witting and nodding. Current plan is to attempt extubation tomorrow, then PT/OT, and likely to [facility] Monday vs Tuesday at this point. Spoke with daughter at bedside as well. Will follow.Marland KitchenMarland Kitchen

## 2016-01-26 NOTE — Progress Notes (Signed)
    Patient PO Day #1 S/P Posterior cervical fusion C4-7 PO day #2 S/P C3-4 ACDF. Surgically patient did well, after extubation yesterday patient found to have rather prominent upper airway swelling and obstruction and was re-intubated and placed on vent. Currently resting comfortably, sedation via precedex followed by CCU. Family was notified and discussed with yesterday PO. No family currently at bedside.   Physical Exam: Vitals:   01/26/16 0600 01/26/16 0700  BP: (!) 160/80 (!) 164/69  Pulse: 76 60  Resp: 17 16  Temp:     CBC Latest Ref Rng & Units 01/26/2016 01/25/2016 01/16/2016  WBC 4.0 - 10.5 K/uL 11.5(H) 9.7 5.9  Hemoglobin 13.0 - 17.0 g/dL 12.7(L) 12.1(L) 14.0  Hematocrit 39.0 - 52.0 % 36.2(L) 35.4(L) 40.0  Platelets 150 - 400 K/uL 107(L) 124(L) 131(L)    Dressing in place, collar in place and fitting comfortably,pt resting comfortably on vent  NVI, SCD's inplace   PO Day #1 S/P Posterior cervical fusion C4-7 PO day #2 S/P C3-4 ACDF.  - Currently on vent for upper airway edema/obstruction, followed by CCU/anesthesia - Once off vent encourage ambulation - Percocet for pain, Valium for muscle spasms  -Scripts printed and signed in chart with D/C instructions  - likely d/c home Sunday, pending vent and progress

## 2016-01-26 NOTE — Op Note (Addendum)
NAME:  [name], [name]                     ACCOUNT NO.:  MEDICAL RECORD NO.:  [id number]  LOCATION:                                 FACILITY:  PHYSICIAN:  Phylliss Bob, MD           DATE OF BIRTH:  DATE OF PROCEDURE:  01/25/2016                              OPERATIVE REPORT   PREOPERATIVE DIAGNOSES: 1. Progressive cervical myelopathy. 2. Spinal cord and neural foraminal compression spanning C3-C7.  POSTOPERATIVE DIAGNOSES: 1. Progressive cervical myelopathy. 2. Spinal cord and neural foraminal compression spanning C3-C7.  PROCEDURE:  (stage 2 of 2) 1. Posterior spinal fusion, C3-4, C4-5, C5-6, C6-7. 2. Placement of posterior segmental instrumentation, C3-C7. 3. Use of morselized allograft-ViviGen. 4. Posterior laminectomy with en bloc removal of the C4 and C5 lamina,     thereby decompressing the C3-4, C4-5, and C5-6 intervertebral     spaces. 5. Bilateral posterior cervical laminotomy with partial facetectomy     and foraminotomy, C6-7. 6. Cranial tongs application and removal. 7. Intraoperative use of fluoroscopy.  SURGEON:  Phylliss Bob, M.D.  ASSISTANT:  Pricilla Holm, PA-C.  ANESTHESIA:  General endotracheal anesthesia.  COMPLICATIONS:  None.  DISPOSITION:  Stable.  ESTIMATED BLOOD LOSS:  200 mL.  INDICATIONS FOR SURGERY:  Briefly, Mr. Kring is a pleasant 83 year old male who did present to me with progressive cervical myelopathy, manifesting as instability and balance and progressive weakness.  I would refer to my operative report dated January 24, 2016, for a full account of the patient's history.  The patient did present today for a stage II of what was to be a two-staged procedure.  OPERATIVE DETAILS:  On January 25, 2016, the patient was brought to surgery and general endotracheal anesthesia was administered.  A [place] head holder was applied to the patient's head, applied by me. The patient was then rolled prone onto a hospital bed.  Gel rolls  were placed under the patient's chest in a longitudinal manner.  The patient's head was secured into the appropriate position and the patient's head was secured to the [place] head holder.  The patient's arms were secured to his sides.  The shoulders were taped to the inferior aspect of the bed.  The neck was then prepped and draped in the usual sterile fashion.  A time-out procedure was performed.  I then made a midline incision overlying the C3-C7 intervertebral spaces.  The fascia was incised at the midline.  The lamina from C3-C7 was identified and subperiosteally exposed.  The facet joints spanning C3-4 to C6-7 were also subperiosteally exposed.  A high-speed bur was used to decorticate the facet joints bilaterally including C3-4, C4-5, C5-6, as well as C6-7.  I then used anatomic landmarks in order to cannulate the C3, C4, and C5 lateral masses.  I then performed a bilateral C6-7 laminotomy, in order to thoroughly decompress the exiting bilateral C7 nerves.  I was happy with the decompression.  This did also allow me to palpate the medial and superior border of the C7 pedicles.  I did use a gearshift probe to cannulate the C7 pedicles bilaterally.  A 3.5-mm tap was used to  cannulate the C7 pedicles bilaterally.  A 3-mm tap was used to prepare the lateral masses from C3-C5 bilaterally.  The wound was then copiously irrigated.  I then proceeded with the decompression portion of the procedure.  I did use a 3-mm bur to prepare a trough centrally, after which point, the C4 and C5 lamina were removed, thereby decompressing the C3-4, C4-5, and C5-6 intervertebral spaces.  I was very pleased with the decompression.  I then used a high-speed bur to decorticate the posterior elements spanning C3-C7.  I then placed 14 x 3.5 mm screws into the lateral masses at C3, C4, and C5 bilaterally.  A 4 x 24 mm screw was placed bilaterally into the C7 pedicles.  I was very pleased with the purchase of  each of the screws.  The appropriate sized posterior cervical rod was contoured into the appropriate degree of lordosis.  The rod was then secured into the tulip heads of the screws and caps were placed and a final locking procedure was performed.  I was very pleased with the final AP and lateral fluoroscopic images.  Of note, prior to placing the screws, the wound was copiously irrigated with a total of approximately 2 L of normal saline.  DBX putty was then packed into the facet joints from C3-C7.  ViviGen was then packed along the posterior elements from C3-C7.  All bleeding was controlled at the termination of the procedure.  The wound was then closed in layers using #1 Vicryl, followed by 2-0 Vicryl, followed by 4-0 Monocryl.  Benzoin and Steri-Strips were applied, followed by sterile dressing.  All instrument counts were correct at the termination of the procedure.  Of note, Pricilla Holm was my assistant throughout surgery, and did aid in retraction, suctioning, and closure from start to finish.     Phylliss Bob, MD     MD/MEDQ  D:  01/25/2016  T:  01/26/2016  Job:  [id number]

## 2016-01-26 NOTE — Anesthesia Postprocedure Evaluation (Signed)
Anesthesia Post Note  Patient: ONA RATHERT  Procedure(s) Performed: Procedure(s) (LRB): POSTERIOR CERVICAL DECOMPRESSION FUSION, CERVICAL 3-4, CERVICAL 4-5, CERVICAL 5-6, CERVICAL 6-7 WITH INSTRUMENTATION AND ALLOGRAFT (N/A)  Patient location during evaluation: PACU Anesthesia Type: General Level of consciousness: awake Pain management: pain level controlled Vital Signs Assessment: post-procedure vital signs reviewed and stable Respiratory status: spontaneous breathing Cardiovascular status: stable Anesthetic complications: no       Last Vitals:  Vitals:   01/26/16 1522 01/26/16 1600  BP: (!) 134/92 (!) 152/68  Pulse: 77 71  Resp: 19 17  Temp:      Last Pain:  Vitals:   01/26/16 1600  TempSrc: Oral  PainSc:                  Tanairi Cypert

## 2016-01-26 NOTE — Progress Notes (Signed)
PULMONARY / CRITICAL CARE MEDICINE   Name: [name] MRN: [id number] DOB: [birth year]    ADMISSION DATE:  01/24/2016 CONSULTATION DATE:  1/11  REFERRING MD:  Lynann Bologna (ortho)   CHIEF COMPLAINT:  Respiratory failure   Brief:    83yo male with hx HTN, prostate cancer, chronic back pain admitted 1/10 for elective 2-stage cervical decompression and C3-C7 fusion.  Underwent first stage (anterior approach C3-C4) 1/10 without difficulty.  Returned to OR 1/11 for second phase (posterior C4-C7) with some c/o SOB.  Extubated in OR but required re-intubation in PACU and PCCM consulted for ICU admit and vent management.  Per Anesthesia report to Dr Chase Caller - vocal cord edema noticed by relatively stratighforward reintubation.   SIGNIFICANT EVENTS: 01/10 > part 1 surgery, anterior decompression / fusion. 01/11 > part 2, posterior.  LINES/TUBES: ETT 1/11>>    SUBJECTIVE:  On precedex gtt, currently weaning with cuff leak.   VITAL SIGNS: BP (!) 151/94   Pulse 71   Temp 99 F (37.2 C) (Oral)   Resp 14   Ht 6' (1.829 m)   Wt 82.6 kg (182 lb 1.6 oz)   SpO2 99%   BMI 24.70 kg/m   HEMODYNAMICS:    VENTILATOR SETTINGS: Vent Mode: CPAP;PSV FiO2 (%):  [40 %-75 %] 40 % Set Rate:  [16 bmp-18 bmp] 16 bmp Vt Set:  [610 mL-620 mL] 620 mL PEEP:  [0 cmH20-5 cmH20] 5 cmH20 Plateau Pressure:  [14 cmH20-16 cmH20] 14 cmH20  INTAKE / OUTPUT: I/O last 3 completed shifts: In: 3984.9 [I.V.:3284.9; IV Piggyback:700] Out: 4627 [Urine:5140; Blood:300]  PHYSICAL EXAMINATION: General:  Adult male, lying in bed, no distress  Neuro: Alert, follows commands, moves all extremities  HEENT:  C collar on . ETT, blood noted in subglottic tube  Cardiovascular:  RRR, no MRG, NI s1/s2  Lungs:  Clear breath sounds, non-labored, on vent  Abdomen:  Soft, non-tender, active bowel sounds  Musculoskeletal:  No deformities   Skin: warm, dry, intact   LABS: PULMONARY No results for input(s): PHART,  PCO2ART, PO2ART, HCO3, TCO2, O2SAT in the last 168 hours.  Invalid input(s): PCO2, PO2  CBC  Recent Labs Lab 01/25/16 1636 01/26/16 0321  HGB 12.1* 12.7*  HCT 35.4* 36.2*  WBC 9.7 11.5*  PLT 124* 107*    COAGULATION No results for input(s): INR in the last 168 hours.  CARDIAC  No results for input(s): TROPONINI in the last 168 hours. No results for input(s): PROBNP in the last 168 hours.   CHEMISTRY  Recent Labs Lab 01/25/16 1636 01/26/16 0321  NA 138 141  K 3.8 3.4*  CL 107 105  CO2 22 23  GLUCOSE 128* 187*  BUN 9 8  CREATININE 0.86 0.88  CALCIUM 9.0 9.2  MG  --  1.7  PHOS  --  2.5   Estimated Creatinine Clearance: 73.5 mL/min (by C-G formula based on SCr of 0.88 mg/dL).   LIVER No results for input(s): AST, ALT, ALKPHOS, BILITOT, PROT, ALBUMIN, INR in the last 168 hours.   INFECTIOUS No results for input(s): LATICACIDVEN, PROCALCITON in the last 168 hours.   ENDOCRINE CBG (last 3)   Recent Labs  01/26/16 0040 01/26/16 0342 01/26/16 0746  GLUCAP 168* 170* 198*         IMAGING x48h  - image(s) personally visualized  -   highlighted in bold Dg Cervical Spine 1 View  Result Date: 01/25/2016 CLINICAL DATA:  Cervical spine fusion at C3-4, C4-5, and C5-6. EXAM: CERVICAL  SPINE 1 VIEW COMPARISON:  CT cervical spine of 12/29/2015 FINDINGS: Cross-table lateral view of the cervical spine in the operating room labeled 1 shows anterior fusion at C3-4. Anterior metallic fixation plate is in good position with interbody fusion plug in good position. Alignment is normal. Clamps are noted on the posterior spinous processes of C4 and C5. IMPRESSION: Anterior fusion at C3-4.  No complicating features. Electronically Signed   By: Ivar Drape M.D.   On: 01/25/2016 13:50   Dg Cervical Spine 2-3 Views  Result Date: 01/25/2016 CLINICAL DATA:  Cervical fusion. EXAM: CERVICAL SPINE - 2-3 VIEW COMPARISON:  01/24/2016. FINDINGS: Posterior fusion from C3 through C7.  Anterior and interbody fusion C3-C4. Hardware intact. Normal alignment. Two images obtained. 0 minutes 13 seconds fluoroscopy time. IMPRESSION: Postsurgical changes cervical spine. Electronically Signed   By: Marcello Moores  Register   On: 01/25/2016 12:52   Portable Chest Xray  Result Date: 01/26/2016 CLINICAL DATA:  Spine surgery. EXAM: PORTABLE CHEST 1 VIEW COMPARISON:  01/25/2016. FINDINGS: Endotracheal tube in stable position. Cardiomegaly with pulmonary vascular prominence. Interim partial clearing of bilateral pulmonary interstitial prominence consistent partial clearing of pulmonary interstitial edema. Low lung volumes. Small left pleural effusion.No pneumothorax . IMPRESSION: 1. Endotracheal tube in stable position. 2. Congestive heart failure with partial clearing of pulmonary interstitial edema. Small left pleural effusion . Electronically Signed   By: Marcello Moores  Register   On: 01/26/2016 07:29   Portable Chest Xray  Result Date: 01/25/2016 CLINICAL DATA:  Intubated patient during cervical spine surgery. EXAM: PORTABLE CHEST 1 VIEW COMPARISON:  Chest x-ray of January 16, 2015 FINDINGS: There are new fluffy alveolar opacities bilaterally. The pulmonary vascularity is engorged. The cardiac silhouette is normal in size. There is no pneumothorax. There is no significant pleural effusion visible. There is calcification in the wall of the aortic arch. The endotracheal tube tip lies 4.4 cm above the carina. IMPRESSION: Bilateral alveolar opacities worrisome for pulmonary edema. Certainly pneumonia cannot be absolutely excluded. The endotracheal tube tip lies 4.4 cm above the carina. Thoracic aortic atherosclerosis. Electronically Signed   By: David  Martinique M.D.   On: 01/25/2016 16:06   Dg C-arm 1-60 Min  Result Date: 01/25/2016 CLINICAL DATA:  Posterior cervical disc fusion. EXAM: DG C-ARM 61-120 MIN FLUOROSCOPY TIME:  Routine seconds. COMPARISON:  January 24, 2016. FINDINGS: Two intraoperative fluoroscopic  images were obtained of the cervical spine. These demonstrate the patient to be status post surgical anterior fusion of C3-4 with interbody fusion. The patient is now status post surgical posterior fusion extending from C3-C7, with bilateral intrapedicular screw placement present at C3, C4, C5 and C7. Good alignment of vertebral bodies is noted. IMPRESSION: Status post surgical posterior fusion extending from C3-C7. Electronically Signed   By: Marijo Conception, M.D.   On: 01/25/2016 12:53    DISCUSSION: 83yo male re-intubated post second phase of 2-phase (anterior and posterior approach) cervical decompression and fusion.    ASSESSMENT / PLAN:  NEUROLOGIC A:   Chemical sedation. Progressive cervical myelopathy with spinal cord compression - s/p 2 part surgery 01/24/16 and 01/25/16. Hx RLS, anxiety.  P:   Wean Precedex to achieve RASS  RASS goal: 0 to -1. Daily WUA. Hold preadmission ropinirole, alprazolam.  PULMONARY A: VDRF - due to failed extubation post op.- vocal cord edema reported P:   Full vent support- Wean as able. (Currently weaning with cuff leak)  VAP prevention measures. CXR in AM.  CARDIOVASCULAR A:  Hx HTN, HLD. P:  Continue  preadmission pravastatin, fenofibrate. Hold preadmission amlodipine, labetalol, losartan.  RENAL A:   AKI. P:   d5 half NS @ 50 Trend BMP   GASTROINTESTINAL A:   GI prophylaxis. Nutrition. P:   SUP: Pantoprazole. After extubation will need bedside swallow study NPO.  HEMATOLOGIC / ONCOLOGIC A:   Mild thrombocytopenia. VTE Prophylaxis. Hx prostate CA. P:  Monitor platelet counts. SCD's / heparin. Trend CBC   INFECTIOUS A:   No indication of infection. P:   Trend fever and wbc curve   ENDOCRINE A:   Hyperglycemia  P:   SSI Q4H glucose checks    Family updated: Family updated at bedside 1/12   Interdisciplinary Family Meeting v Palliative Care Meeting:  Due by: 1/18'/18   Hayden Pedro,  AG-ACNP [facility]  Pgr: [phone]  PCCM Pgr: [phone]  Attending Note:  I have examined patient, reviewed labs, studies and notes. I have discussed the case with Beaulah Corin, and I agree with the data and plans as amended above. 83 yo man s/p anterior and then posterior cervical fusions. He required re-intubation after the second procedure, was noted to have some UA edema on DL. Received single dose steroids. Has been ventilated for the last 24h. On my eval he is wide awake and is tolerating PSV well. His lungs are clear. He is in a c-collar. I personally rechecked for a cuff leak and could not detect one. I believe we should defer extubation for another day. Will hold off on steroids given concerns for wound healing. Hopefully extubate 1/13. I discussed the plan with his daughter at bedside. Independent critical care time is 40 minutes.   Baltazar Apo, MD, PhD 01/26/2016, 2:08 PM [facility] [phone] or if no answer [phone]

## 2016-01-27 DIAGNOSIS — J384 Edema of larynx: Secondary | ICD-10-CM

## 2016-01-27 DIAGNOSIS — E43 Unspecified severe protein-calorie malnutrition: Secondary | ICD-10-CM

## 2016-01-27 LAB — CBC
HCT: 35.3 % — ABNORMAL LOW (ref 39.0–52.0)
Hemoglobin: 12.5 g/dL — ABNORMAL LOW (ref 13.0–17.0)
MCH: 34 pg (ref 26.0–34.0)
MCHC: 35.4 g/dL (ref 30.0–36.0)
MCV: 95.9 fL (ref 78.0–100.0)
PLATELETS: 117 10*3/uL — AB (ref 150–400)
RBC: 3.68 MIL/uL — ABNORMAL LOW (ref 4.22–5.81)
RDW: 13.6 % (ref 11.5–15.5)
WBC: 10.6 10*3/uL — ABNORMAL HIGH (ref 4.0–10.5)

## 2016-01-27 LAB — GLUCOSE, CAPILLARY
GLUCOSE-CAPILLARY: 104 mg/dL — AB (ref 65–99)
Glucose-Capillary: 141 mg/dL — ABNORMAL HIGH (ref 65–99)
Glucose-Capillary: 146 mg/dL — ABNORMAL HIGH (ref 65–99)
Glucose-Capillary: 114 mg/dL — ABNORMAL HIGH (ref 65–99)
Glucose-Capillary: 119 mg/dL — ABNORMAL HIGH (ref 65–99)
Glucose-Capillary: 126 mg/dL — ABNORMAL HIGH (ref 65–99)

## 2016-01-27 LAB — BASIC METABOLIC PANEL
Anion gap: 6 (ref 5–15)
BUN: 12 mg/dL (ref 6–20)
CALCIUM: 9.2 mg/dL (ref 8.9–10.3)
CO2: 30 mmol/L (ref 22–32)
CREATININE: 0.94 mg/dL (ref 0.61–1.24)
Chloride: 103 mmol/L (ref 101–111)
GFR calc Af Amer: >60 mL/min (ref >60)
GFR calc non Af Amer: >60 mL/min (ref >60)
GLUCOSE: 151 mg/dL — AB (ref 65–99)
Potassium: 3.9 mmol/L (ref 3.5–5.1)
Sodium: 139 mmol/L (ref 135–145)

## 2016-01-27 LAB — PHOSPHORUS: Phosphorus: 2 mg/dL — ABNORMAL LOW (ref 2.5–4.6)

## 2016-01-27 LAB — MAGNESIUM: MAGNESIUM: 1.7 mg/dL (ref 1.7–2.4)

## 2016-01-27 MED ORDER — ORAL CARE MOUTH RINSE
15.0000 mL | Freq: Two times a day (BID) | OROMUCOSAL | Status: DC
  Administered 2016-01-27 – 2016-01-30 (×5): 15 mL via OROMUCOSAL

## 2016-01-27 MED ORDER — VITAL AF 1.2 CAL PO LIQD: 1000.0000 mL | ORAL | Status: DC

## 2016-01-27 MED ORDER — HYDROCODONE-ACETAMINOPHEN 5-325 MG PO TABS
1.0000 | ORAL_TABLET | ORAL | Status: DC | PRN
  Administered 2016-01-27 – 2016-01-30 (×8): 1 via ORAL
  Filled 2016-01-27 (×8): qty 1

## 2016-01-27 MED ORDER — PRO-STAT SUGAR FREE PO LIQD: 30.0000 mL | Freq: Two times a day (BID) | ORAL | Status: DC

## 2016-01-27 MED ORDER — CHLORHEXIDINE GLUCONATE 0.12 % MT SOLN
15.0000 mL | Freq: Two times a day (BID) | OROMUCOSAL | Status: DC
  Administered 2016-01-27 – 2016-01-30 (×5): 15 mL via OROMUCOSAL
  Filled 2016-01-27 (×3): qty 15

## 2016-01-27 NOTE — Progress Notes (Signed)
PULMONARY / CRITICAL CARE MEDICINE   Name: [name] MRN: [id number] DOB: 10/14/1933    ADMISSION DATE:  01/24/2016 CONSULTATION DATE:  1/11  REFERRING MD:  Lynann Bologna (ortho)   CHIEF COMPLAINT:  Respiratory failure   Brief:    83yo male with hx HTN, prostate cancer, chronic back pain admitted 1/10 for elective 2-stage cervical decompression and C3-C7 fusion.  Underwent first stage (anterior approach C3-C4) 1/10 without difficulty.  Returned to OR 1/11 for second phase (posterior C4-C7) with some c/o SOB.  Extubated in OR but required re-intubation in PACU and PCCM consulted for ICU admit and vent management.  Per Anesthesia report to Dr Chase Caller - vocal cord edema noticed by relatively stratighforward reintubation.   SIGNIFICANT EVENTS: 01/10 > part 1 surgery, anterior decompression / fusion. 01/11 > part 2, posterior.  LINES/TUBES: ETT 1/11>>    SUBJECTIVE:  01/27/2016  On precedex gtt, helps with intermittent agitation.  Did well overnight .  Awake and alert this am , following commands.  Moves all extremities well.     VITAL SIGNS: BP (!) 141/70   Pulse 74   Temp 98.4 F (36.9 C) (Axillary)   Resp (!) 23   Ht 6' (1.829 m)   Wt 82.6 kg (182 lb 1.6 oz)   SpO2 100%   BMI 24.70 kg/m   HEMODYNAMICS:    VENTILATOR SETTINGS: Vent Mode: PRVC FiO2 (%):  [40 %] 40 % Set Rate:  [16 bmp] 16 bmp Vt Set:  [620 mL] 620 mL PEEP:  [5 cmH20] 5 cmH20 Plateau Pressure:  [12 cmH20-16 cmH20] 16 cmH20  INTAKE / OUTPUT: I/O last 3 completed shifts: In: 2608.6 [I.V.:2408.6; IV Piggyback:200] Out: 4440 [Urine:4440]  PHYSICAL EXAMINATION: GEN: A/Ox3; pleasant  NAD    HEENT:  ETT in place   RESP  CTA w/ o no wheezing   CARD:  RRR, no m/r/g  , no peripheral edema, pulses intact, no cyanosis or clubbing.  GI:   Soft & nt; nml bowel sounds; no organomegaly or masses detected.   Musco: Warm bil, no deformities or joint swelling noted. C Collar in place,   Neuro: alert,  no focal deficits noted., MAEWx 4 , nml grips   Skin: Warm, no lesions or rashes   LABS: PULMONARY No results for input(s): PHART, PCO2ART, PO2ART, HCO3, TCO2, O2SAT in the last 168 hours.  Invalid input(s): PCO2, PO2  CBC  Recent Labs Lab 01/25/16 1636 01/26/16 0321 01/27/16 0301  HGB 12.1* 12.7* 12.5*  HCT 35.4* 36.2* 35.3*  WBC 9.7 11.5* 10.6*  PLT 124* 107* 117*    COAGULATION No results for input(s): INR in the last 168 hours.  CARDIAC  No results for input(s): TROPONINI in the last 168 hours. No results for input(s): PROBNP in the last 168 hours.   CHEMISTRY  Recent Labs Lab 01/25/16 1636 01/26/16 0321 01/27/16 0301  NA 138 141 139  K 3.8 3.4* 3.9  CL 107 105 103  CO2 '22 23 30  '$ GLUCOSE 128* 187* 151*  BUN '9 8 12  '$ CREATININE 0.86 0.88 0.94  CALCIUM 9.0 9.2 9.2  MG  --  1.7 1.7  PHOS  --  2.5 2.0*   Estimated Creatinine Clearance: 68.8 mL/min (by C-G formula based on SCr of 0.94 mg/dL).   LIVER No results for input(s): AST, ALT, ALKPHOS, BILITOT, PROT, ALBUMIN, INR in the last 168 hours.   INFECTIOUS No results for input(s): LATICACIDVEN, PROCALCITON in the last 168 hours.   ENDOCRINE CBG (last  3)   Recent Labs  01/26/16 1916 01/26/16 2321 01/27/16 0325  GLUCAP 129* 153* 141*         IMAGING x48h  - image(s) personally visualized  -   highlighted in bold Dg Cervical Spine 1 View  Result Date: 01/25/2016 CLINICAL DATA:  Cervical spine fusion at C3-4, C4-5, and C5-6. EXAM: CERVICAL SPINE 1 VIEW COMPARISON:  CT cervical spine of 12/29/2015 FINDINGS: Cross-table lateral view of the cervical spine in the operating room labeled 1 shows anterior fusion at C3-4. Anterior metallic fixation plate is in good position with interbody fusion plug in good position. Alignment is normal. Clamps are noted on the posterior spinous processes of C4 and C5. IMPRESSION: Anterior fusion at C3-4.  No complicating features. Electronically Signed   By: Ivar Drape M.D.   On: 01/25/2016 13:50   Dg Cervical Spine 2-3 Views  Result Date: 01/25/2016 CLINICAL DATA:  Cervical fusion. EXAM: CERVICAL SPINE - 2-3 VIEW COMPARISON:  01/24/2016. FINDINGS: Posterior fusion from C3 through C7. Anterior and interbody fusion C3-C4. Hardware intact. Normal alignment. Two images obtained. 0 minutes 13 seconds fluoroscopy time. IMPRESSION: Postsurgical changes cervical spine. Electronically Signed   By: Marcello Moores  Register   On: 01/25/2016 12:52   Portable Chest Xray  Result Date: 01/26/2016 CLINICAL DATA:  Spine surgery. EXAM: PORTABLE CHEST 1 VIEW COMPARISON:  01/25/2016. FINDINGS: Endotracheal tube in stable position. Cardiomegaly with pulmonary vascular prominence. Interim partial clearing of bilateral pulmonary interstitial prominence consistent partial clearing of pulmonary interstitial edema. Low lung volumes. Small left pleural effusion.No pneumothorax . IMPRESSION: 1. Endotracheal tube in stable position. 2. Congestive heart failure with partial clearing of pulmonary interstitial edema. Small left pleural effusion . Electronically Signed   By: Marcello Moores  Register   On: 01/26/2016 07:29   Portable Chest Xray  Result Date: 01/25/2016 CLINICAL DATA:  Intubated patient during cervical spine surgery. EXAM: PORTABLE CHEST 1 VIEW COMPARISON:  Chest x-ray of January 16, 2015 FINDINGS: There are new fluffy alveolar opacities bilaterally. The pulmonary vascularity is engorged. The cardiac silhouette is normal in size. There is no pneumothorax. There is no significant pleural effusion visible. There is calcification in the wall of the aortic arch. The endotracheal tube tip lies 4.4 cm above the carina. IMPRESSION: Bilateral alveolar opacities worrisome for pulmonary edema. Certainly pneumonia cannot be absolutely excluded. The endotracheal tube tip lies 4.4 cm above the carina. Thoracic aortic atherosclerosis. Electronically Signed   By: David  Martinique M.D.   On: 01/25/2016 16:06   Dg  C-arm 1-60 Min  Result Date: 01/25/2016 CLINICAL DATA:  Posterior cervical disc fusion. EXAM: DG C-ARM 61-120 MIN FLUOROSCOPY TIME:  Routine seconds. COMPARISON:  January 24, 2016. FINDINGS: Two intraoperative fluoroscopic images were obtained of the cervical spine. These demonstrate the patient to be status post surgical anterior fusion of C3-4 with interbody fusion. The patient is now status post surgical posterior fusion extending from C3-C7, with bilateral intrapedicular screw placement present at C3, C4, C5 and C7. Good alignment of vertebral bodies is noted. IMPRESSION: Status post surgical posterior fusion extending from C3-C7. Electronically Signed   By: Marijo Conception, M.D.   On: 01/25/2016 12:53    DISCUSSION: 83yo male re-intubated post second phase of 2-phase (anterior and posterior approach) cervical decompression and fusion.    ASSESSMENT / PLAN:  NEUROLOGIC A:   Chemical sedation. Progressive cervical myelopathy with spinal cord compression - s/p 2 part surgery 01/24/16 and 01/25/16. Hx RLS, anxiety.  P:   Wean Precedex  to achieve RASS  RASS goal: 0 to -1. Daily WUA. Hold preadmission ropinirole, alprazolam.  PULMONARY A: VDRF - due to failed extubation post op.- vocal cord edema reported P:   Full vent support- Wean as able. (Currently weaning with cuff leak) Evaluate for extubation today .  VAP prevention measures. CXR in AM.  CARDIOVASCULAR A:  Hx HTN, HLD. P:  Continue preadmission pravastatin, fenofibrate. Hold preadmission amlodipine, labetalol, losartan.  RENAL A:   AKI. P:   d5 half NS @ 50 Trend BMP   GASTROINTESTINAL A:   GI prophylaxis. Nutrition. P:   SUP: Pantoprazole. After extubation will need bedside swallow study NPO.  HEMATOLOGIC / ONCOLOGIC A:   Mild thrombocytopenia. VTE Prophylaxis. Hx prostate CA. P:  Monitor platelet counts. SCD's / heparin. Trend CBC   INFECTIOUS A:   No indication of infection. P:    Trend fever and wbc curve   ENDOCRINE A:   Hyperglycemia  P:   SSI Q4H glucose checks    Family updated: Family updated at bedside 1/13  Interdisciplinary Family Meeting v Palliative Care Meeting:  Due by: 1/18'/18    Aiesha Leland NP-C  [facility]  [phone]

## 2016-01-27 NOTE — Evaluation (Signed)
Clinical/Bedside Swallow Evaluation Patient Details  Name: [name] MRN: [id number] Date of Birth: 1933-01-30  Today's Date: 01/27/2016 Time: SLP Start Time (ACUTE ONLY): 1419 SLP Stop Time (ACUTE ONLY): 1434 SLP Time Calculation (min) (ACUTE ONLY): 15 min  Past Medical History:  Past Medical History:  Diagnosis Date  . Anxiety    takes Xanax nightly  . Arthritis   . Cancer [facility])    prostate  . Chronic neck pain   . Depression    wife and son died last yr. Not on any meds  . History of colon polyps    "pre cancerous"  . History of vertigo   . HOH (hard of hearing)   . Hyperlipidemia    takes Pravastatin and Fenofibrate daily  . Hypertension    takes Losartan,Labetalol,and Amlodipine daily  . Restless leg   . Urinary frequency   . Varicose veins   . Weakness    numbness in both hands   Past Surgical History:  Past Surgical History:  Procedure Laterality Date  . ANTERIOR CERVICAL DECOMP/DISCECTOMY FUSION N/A 01/24/2016   Procedure: ANTERIOR CERVICAL DECOMPRESSION FUSION CERVICAL 3-4 INSTRUMENTATION AND ALLOGRAFT;  Surgeon: Phylliss Bob, MD;  Location: [facility];  Service: Orthopedics;  Laterality: N/A;  ANTERIOR CERVICAL DECOMPRESSION FUSION CERVICAL 3-4 INSTRUMENTATION AND ALLOGRAFT  . BACK SURGERY    . CARPAL TUNNEL RELEASE Left 2017  . COLONOSCOPY    . EYE SURGERY Bilateral    cataract removal  . HERNIA REPAIR    . INNER EAR SURGERY Left    busted ear drum  . JOINT REPLACEMENT Left    knee replacement  . penile prothesis    . POSTERIOR CERVICAL FUSION/FORAMINOTOMY N/A 01/25/2016   Procedure: POSTERIOR CERVICAL DECOMPRESSION FUSION, CERVICAL 3-4, CERVICAL 4-5, CERVICAL 5-6, CERVICAL 6-7 WITH INSTRUMENTATION AND ALLOGRAFT;  Surgeon: Phylliss Bob, MD;  Location: [facility];  Service: Orthopedics;  Laterality: N/A;  POSTERIOR CERVICAL DECOMPRESSION FUSION, CERVICAL 3-4, CERVICAL 4-5, CERVICAL 5-6, CERVICAL 6-7 WITH INSTRUMENTATION AND ALLOGRAFT  . PROSTATE SURGERY    .  REVERSE SHOULDER ARTHROPLASTY Left 05/04/2015   Procedure: LEFT REVERSE SHOULDER ARTHROPLASTY;  Surgeon: Justice Britain, MD;  Location: [facility];  Service: Orthopedics;  Laterality: Left;   HPI:  [name] a 83 y.o.malewho presents with ongoing and progressive deterioration in balance and fine motor skills. PMH:   Assessment / Plan / Recommendation Clinical Impression  Pt with multiple risk factors for dysphagia including ACDF and PCDF in 2 days (intubation x 2). Decreased management of secretions upon SLP arrival and consistent immediate and delayed throat clears with po's. His vocal quality is mildly hoarse and intensity good however would not recommend any po's other than ice chips following oral care. Discusssed with RN. SLP will return to determine swallow status.     Aspiration Risk  Moderate aspiration risk    Diet Recommendation Ice chips PRN after oral care        Other  Recommendations Oral Care Recommendations: Oral care QID   Follow up Recommendations  (TBD)      Frequency and Duration min 2x/week  2 weeks       Prognosis Prognosis for Safe Diet Advancement: Good      Swallow Study   General HPI: [name] Shoresis a 83 y.o.malewho presents with ongoing and progressive deterioration in balance and fine motor skills. PMH: Type of Study: Bedside Swallow Evaluation Previous Swallow Assessment:  (none) Diet Prior to this Study: NPO Temperature Spikes Noted: No Respiratory Status:  Nasal cannula History of Recent Intubation: Yes Length of Intubations (days):  (x 2 since 1/10) Date extubated: 01/27/16 (9:30) Behavior/Cognition: Alert;Cooperative;Pleasant mood (HOH) Oral Cavity Assessment: Within Functional Limits Oral Care Completed by SLP: No Oral Cavity - Dentition: Dentures, top;Dentures, bottom Vision: Functional for self-feeding Self-Feeding Abilities: Able to feed self;Needs set up Patient Positioning: Upright in bed Baseline Vocal Quality: Hoarse  (mildly hoarse) Volitional Cough: Strong Volitional Swallow: Able to elicit    Oral/Motor/Sensory Function Overall Oral Motor/Sensory Function: Within functional limits   Ice Chips Ice chips: Impaired Presentation: Self Fed Pharyngeal Phase Impairments: Throat Clearing - Immediate;Multiple swallows   Thin Liquid Thin Liquid: Impaired Presentation: Cup Pharyngeal  Phase Impairments: Throat Clearing - Immediate;Throat Clearing - Delayed;Multiple swallows    Nectar Thick Nectar Thick Liquid: Not tested   Honey Thick Honey Thick Liquid: Not tested   Puree Puree: Impaired Presentation: Spoon;Self Fed Pharyngeal Phase Impairments: Throat Clearing - Immediate;Multiple swallows   Solid   GO   Solid: Not tested        Houston Siren 01/27/2016,3:19 PM  Orbie Pyo Colvin Caroli.Ed [employer] [phone]

## 2016-01-27 NOTE — Progress Notes (Addendum)
Initial Nutrition Assessment  DOCUMENTATION CODES:   Not applicable  INTERVENTION:   Plan to order supplements once oral diet initiated- Patient likes strawberry Ensure   NUTRITION DIAGNOSIS:   Inadequate oral intake related to inability to eat as evidenced by NPO status.  GOAL:   Patient will meet greater than or equal to 90% of their needs  MONITOR:   PO intake, Supplement acceptance, Diet advancement, Labs, I & O's  REASON FOR ASSESSMENT:   Ventilator    ASSESSMENT:   83yo male with hx HTN, prostate cancer, chronic back pain admitted 1/10 for elective 2-stage cervical decompression and C3-C7 fusion.  Underwent first stage (anterior approach C3-C4) 1/10 without difficulty.  Returned to OR 1/11 for second phase (posterior C4-C7) with some c/o SOB.  Extubated in OR but required re-intubation in PACU and PCCM consulted for ICU admit and vent management.  Per Anesthesia report to Dr Chase Caller - vocal cord edema noticed by relatively stratighforward reintubation.    Spoke to RN, pt extubated today. Pt will need swallow evaluation before starting oral diet. Per chart, pt is weight stable. Will order supplements if pt able to have oral diet. Pt likes Ensure. Per RN, pt refused OGT while intubated and would likely refuse NGT.    Met with pt in room today. Pt reports good appetite pta. Pt states that he feels like he could eat something. SLP evaluated pt today and recommened pt remain NPO. Plan is to reevaluate tomorrow.   Medications reviewed and include: vit D, colace, fenofibrate, insulin, MVI, protonix, precedex, dextrose, fentanyl   Labs reviewed: P 2.0(L), Mg 1.7 wnl Wbc- 10.6(H) cbgs- 128, 187, 151 x 24 hrs  Nutrition-Focused physical exam completed. Findings are mild fat depletion in orbital region, severe muscle depletion in temporal and clavicles, and mild edema in face.   Diet Order:  Diet NPO time specified  Skin:  Wound (see comment) (neck and shoulder incision  )  Last BM:  none since admit  Height:   Ht Readings from Last 1 Encounters:  01/25/16 6' (1.829 m)    Weight:   Wt Readings from Last 1 Encounters:  01/24/16 182 lb 1.6 oz (82.6 kg)    Ideal Body Weight:  80.9 kg  BMI:  Body mass index is 24.7 kg/m.  Estimated Nutritional Needs:   Kcal:  2000-2400kcal/day   Protein:  91-108g/day   Fluid:  2L/day   EDUCATION NEEDS:   No education needs identified at this time  Koleen Distance, RD, LDN Pager #[phone]

## 2016-01-27 NOTE — Procedures (Signed)
Extubation Procedure Note  Patient Details:   Name: [name] DOB: December 12, 1933 MRN: [id number]   Airway Documentation:     Evaluation  O2 sats: stable throughout Complications: No apparent complications Patient did tolerate procedure well. Bilateral Breath Sounds: Clear, Diminished   Yes  Pt extubated & placed on 4lpm [place]   Cordella Register 01/27/2016, 9:26 AM

## 2016-01-27 NOTE — Progress Notes (Signed)
PATIENT ID: [name]  MRN: [id number]  DOB/AGE:  [birth year] / 83 y.o.  2 Days Post-Op Procedure(s) (LRB): POSTERIOR CERVICAL DECOMPRESSION FUSION, CERVICAL 3-4, CERVICAL 4-5, CERVICAL 5-6, CERVICAL 6-7 WITH INSTRUMENTATION AND ALLOGRAFT (N/A)    PROGRESS NOTE Subjective:   Patient is alert, oriented.  Intubated and gestures when will he be able to eat and drink.    Objective: Vital signs in last 24 hours: Temp:  [97.4 F (36.3 C)-98.4 F (36.9 C)] 98.4 F (36.9 C) (01/13 0400) Pulse Rate:  [52-78] 61 (01/13 0800) Resp:  [13-23] 16 (01/13 0800) BP: (134-161)/(62-116) 157/72 (01/13 0800) SpO2:  [94 %-100 %] 99 % (01/13 0805) FiO2 (%):  [40 %] 40 % (01/13 0805)    Intake/Output from previous day: I/O last 3 completed shifts: In: 2683.4 [I.V.:2483.4; IV CVELFYBOF:751] Out: 0258 [Urine:4440]   Intake/Output this shift: Total I/O In: 74 [I.V.:74] Out: -    LABORATORY DATA:  Recent Labs  01/26/16 0321  01/26/16 1916 01/26/16 2321 01/27/16 0301 01/27/16 0325  WBC 11.5*  --   --   --  10.6*  --   HGB 12.7*  --   --   --  12.5*  --   HCT 36.2*  --   --   --  35.3*  --   PLT 107*  --   --   --  117*  --   NA 141  --   --   --  139  --   K 3.4*  --   --   --  3.9  --   CL 105  --   --   --  103  --   CO2 23  --   --   --  30  --   BUN 8  --   --   --  12  --   CREATININE 0.88  --   --   --  0.94  --   GLUCOSE 187*  --   --   --  151*  --   GLUCAP  --   < > 129* 153*  --  141*  CALCIUM 9.2  --   --   --  9.2  --   < > = values in this interval not displayed.  Examination: Neurologically intact Neurovascular intact Sensation intact distally Intact pulses distally Dorsiflexion/Plantar flexion intact}  Assessment:   2 Days Post-Op Procedure(s) (LRB): POSTERIOR CERVICAL DECOMPRESSION FUSION, CERVICAL 3-4, CERVICAL 4-5, CERVICAL 5-6, CERVICAL 6-7 WITH INSTRUMENTATION AND ALLOGRAFT (N/A) ADDITIONAL DIAGNOSIS:  Hypertension and anxiety and depression  Plan:   Plan  is to attempt extubation today.  Once extubated and medically stable, pt will be transferred to [facility] for PT and OT.  Likely D/C to [facility] Monday vs Tuesday.  Philly Collar at bedside for bathing.  Max 10 lb lift.  Per nurse once he has been extubated and he has speech evaluation, he will have to wait 4 hrs prior to starting liquids and solids.     PHILLIPS, ERIC R 01/27/2016, 8:42 AM

## 2016-01-28 ENCOUNTER — Inpatient Hospital Stay (HOSPITAL_COMMUNITY): Payer: Medicare Other

## 2016-01-28 DIAGNOSIS — E878 Other disorders of electrolyte and fluid balance, not elsewhere classified: Secondary | ICD-10-CM

## 2016-01-28 DIAGNOSIS — E876 Hypokalemia: Secondary | ICD-10-CM

## 2016-01-28 LAB — GLUCOSE, CAPILLARY
GLUCOSE-CAPILLARY: 167 mg/dL — AB (ref 65–99)
GLUCOSE-CAPILLARY: 162 mg/dL — AB (ref 65–99)
Glucose-Capillary: 117 mg/dL — ABNORMAL HIGH (ref 65–99)
Glucose-Capillary: 136 mg/dL — ABNORMAL HIGH (ref 65–99)
Glucose-Capillary: 180 mg/dL — ABNORMAL HIGH (ref 65–99)

## 2016-01-28 LAB — CBC
HCT: 35.6 % — ABNORMAL LOW (ref 39.0–52.0)
Hemoglobin: 12.3 g/dL — ABNORMAL LOW (ref 13.0–17.0)
MCH: 33.2 pg (ref 26.0–34.0)
MCHC: 34.6 g/dL (ref 30.0–36.0)
MCV: 96 fL (ref 78.0–100.0)
PLATELETS: 141 10*3/uL — AB (ref 150–400)
RBC: 3.71 MIL/uL — AB (ref 4.22–5.81)
RDW: 13.7 % (ref 11.5–15.5)
WBC: 9.2 10*3/uL (ref 4.0–10.5)

## 2016-01-28 LAB — BASIC METABOLIC PANEL
ANION GAP: 10 (ref 5–15)
ANION GAP: 9 (ref 5–15)
BUN: 14 mg/dL (ref 6–20)
BUN: 18 mg/dL (ref 6–20)
CALCIUM: 9.4 mg/dL (ref 8.9–10.3)
CO2: 26 mmol/L (ref 22–32)
CO2: 28 mmol/L (ref 22–32)
CREATININE: 1.06 mg/dL (ref 0.61–1.24)
Calcium: 9.3 mg/dL (ref 8.9–10.3)
Chloride: 105 mmol/L (ref 101–111)
Chloride: 105 mmol/L (ref 101–111)
Creatinine, Ser: 0.91 mg/dL (ref 0.61–1.24)
Glucose, Bld: 111 mg/dL — ABNORMAL HIGH (ref 65–99)
Glucose, Bld: 139 mg/dL — ABNORMAL HIGH (ref 65–99)
POTASSIUM: 2.9 mmol/L — AB (ref 3.5–5.1)
Potassium: 2.9 mmol/L — ABNORMAL LOW (ref 3.5–5.1)
SODIUM: 141 mmol/L (ref 135–145)
Sodium: 142 mmol/L (ref 135–145)

## 2016-01-28 LAB — TRIGLYCERIDES: Triglycerides: 143 mg/dL (ref <150)

## 2016-01-28 LAB — MAGNESIUM: Magnesium: 1.9 mg/dL (ref 1.7–2.4)

## 2016-01-28 MED ORDER — ZOLPIDEM TARTRATE 5 MG PO TABS
5.0000 mg | ORAL_TABLET | Freq: Every evening | ORAL | Status: DC | PRN
Start: 2016-01-28 — End: 2016-01-30
  Administered 2016-01-28 – 2016-01-29 (×2): 5 mg via ORAL
  Filled 2016-01-28 (×2): qty 1

## 2016-01-28 MED ORDER — LABETALOL HCL 200 MG PO TABS
200.0000 mg | ORAL_TABLET | Freq: Two times a day (BID) | ORAL | Status: DC
  Administered 2016-01-28 – 2016-01-30 (×5): 200 mg via ORAL
  Filled 2016-01-28: qty 1
  Filled 2016-01-28: qty 2
  Filled 2016-01-28 (×3): qty 1

## 2016-01-28 MED ORDER — POTASSIUM CHLORIDE CRYS ER 20 MEQ PO TBCR
40.0000 meq | EXTENDED_RELEASE_TABLET | Freq: Once | ORAL | Status: AC
  Administered 2016-01-28: 40 meq via ORAL
  Filled 2016-01-28: qty 2

## 2016-01-28 MED ORDER — SODIUM CHLORIDE 0.9 % IV SOLN
INTRAVENOUS | Status: DC
  Administered 2016-01-28: 20:00:00 via INTRAVENOUS

## 2016-01-28 MED ORDER — POTASSIUM CHLORIDE CRYS ER 20 MEQ PO TBCR
40.0000 meq | EXTENDED_RELEASE_TABLET | Freq: Two times a day (BID) | ORAL | Status: DC
  Administered 2016-01-28: 40 meq via ORAL
  Filled 2016-01-28: qty 2

## 2016-01-28 MED ORDER — POTASSIUM CHLORIDE 20 MEQ PO PACK
20.0000 meq | PACK | Freq: Every morning | ORAL | Status: DC
  Administered 2016-01-29 – 2016-01-30 (×2): 20 meq via ORAL
  Filled 2016-01-28 (×2): qty 1

## 2016-01-28 MED ORDER — SODIUM CHLORIDE 0.9 % IV SOLN
30.0000 meq | Freq: Once | INTRAVENOUS | Status: AC
  Administered 2016-01-28: 30 meq via INTRAVENOUS
  Filled 2016-01-28: qty 15

## 2016-01-28 MED ORDER — STARCH (THICKENING) PO POWD
ORAL | Status: DC | PRN
Start: 2016-01-28 — End: 2016-01-30
  Filled 2016-01-28: qty 227

## 2016-01-28 MED ORDER — ZOLPIDEM TARTRATE 5 MG PO TABS: 5.0000 mg | ORAL_TABLET | Freq: Every evening | ORAL | Status: DC | PRN

## 2016-01-28 NOTE — Progress Notes (Signed)
Rehab Admissions Coordinator Note:  Patient was screened by Retta Diones for appropriateness for an Inpatient Acute Rehab Consult.  At this time, we are recommending [facility].  Retta Diones 01/28/2016, 7:00 PM  I can be reached at [phone].

## 2016-01-28 NOTE — Progress Notes (Signed)
Unable to replace K of 2.9 with Elink electrolyte protocol. No urine output recorded.

## 2016-01-28 NOTE — Progress Notes (Signed)
Speech Language Pathology Treatment: Dysphagia  Patient Details Name: [name] MRN: [id number] DOB: [birth year] Today's Date: 01/28/2016 Time: 4097-3532 SLP Time Calculation (min) (ACUTE ONLY): 14 min  Assessment / Plan / Recommendation Clinical Impression  Patient reports he swallowed pills in applesauce and it "took me 4 or 5 swallows to get it down." Presented PO trials of ice chips and thin liquids. Patient with delayed throat clear, cough following thin liquids. Provided education regarding instrumental testing to rule out aspiration, aid in determining safest, least restrictive diet and identify compensatory techniques to improve swallow function. RN states patient does not desire alternative nutrition. MBS pending, ~11:00 today. Patient and daughter are in agreement with this plan.    HPI HPI: [name] a 83 y.o.malewho presents with ongoing and progressive deterioration in balance and fine motor skills. 83yo male with hx HTN, prostate cancer, chronic back pain admitted 1/10 for elective 2-stage cervical decompression and C3-C7 fusion.  Underwent first stage (anterior approach C3-C4) 1/10 without difficulty.  Returned to OR 1/11 for second phase (posterior C4-C7) with some c/o SOB.  Extubated in OR but required re-intubation in PACU and PCCM consulted for ICU admit and vent management.  Per Anesthesia report to Dr Chase Caller - vocal cord edema noticed by relatively stratighforward reintubation.       SLP Plan  MBS     Recommendations  Diet recommendations: Other(comment) (defer pending mbs)                Oral Care Recommendations: Oral care QID Follow up Recommendations: Other (comment) (TBD) Plan: MBS       GO              Deneise Lever, [place] CF-SLP Speech-Language Pathologist [phone]  Aliene Altes 01/28/2016, 10:01 AM

## 2016-01-28 NOTE — Progress Notes (Signed)
Patient ID: [name], male   DOB: Nov 05, 1933, 83 y.o.   MRN: [id number]   Called by ortho PA requesting assistance with potassium repletion.  Ordered KCL 40 mEq BID x 2 doses and 40mQ via IV.  Repeat BMP in 6 hours.   Check mag level and replete if needed.    If measure ineffective, please contact [facility].  Thank you.

## 2016-01-28 NOTE — Progress Notes (Signed)
Patient arrived to unit.  Alert, verbally pleasant. Denies pain or discomfort.  Did state he made need to move his bowels.  Patient able to stand with walker and pivot to beside commode.  No results at this time.  Patient family at bedside.

## 2016-01-28 NOTE — Evaluation (Signed)
Physical Therapy Evaluation Patient Details Name: [name] MRN: [id number] DOB: [birth year] Today's Date: 01/28/2016   History of Present Illness  Pt is an 83 y.o. male s/p Posterior spinal fusion C3-7. PMHx: Anxiety, Cancer, Depression, Hx of vertigo, Hyperlipidemia, HTN, Restless leg, Weakness in bil hands.  Clinical Impression  Patient demonstrates deficits in functional mobility as indicated below. Will need continued skilled PT to address deficits and maximize function. Will see as indicated and progress as tolerated. Recommend ST SNF upon acute discharge.    Follow Up Recommendations SNF;Supervision/Assistance - 24 hour    Equipment Recommendations  None recommended by PT    Recommendations for Other Services       Precautions / Restrictions Precautions Precautions: Fall;Cervical Precaution Comments: Educated pt on cervical precautions Required Braces or Orthoses: Cervical Brace Cervical Brace: Hard collar Restrictions Weight Bearing Restrictions: No      Mobility  Bed Mobility               General bed mobility comments: Pt OOB in chair upon arrival  Transfers Overall transfer level: Needs assistance Equipment used: Rolling walker (2 wheeled) Transfers: Sit to/from Stand Sit to Stand: Mod assist;+2 physical assistance         General transfer comment: Mod +2 for sit to stand from chair. Cues for hand placement and technique.  Ambulation/Gait Ambulation/Gait assistance: Min assist;+2 physical assistance Ambulation Distance (Feet): 8 Feet Assistive device: Rolling walker (2 wheeled) Gait Pattern/deviations: Step-through pattern;Decreased stride length;Trunk flexed;Narrow base of support Gait velocity: decreased   General Gait Details: limited distance due to urinary incontinence  Stairs            Wheelchair Mobility    Modified Rankin (Stroke Patients Only)       Balance Overall balance assessment: Needs  assistance Sitting-balance support: Feet supported;Bilateral upper extremity supported Sitting balance-Leahy Scale: Fair     Standing balance support: Bilateral upper extremity supported Standing balance-Leahy Scale: Poor                               Pertinent Vitals/Pain Pain Assessment: Faces Faces Pain Scale: Hurts little more Pain Location: neck Pain Descriptors / Indicators: Sore Pain Intervention(s): Monitored during session    Home Living Family/patient expects to be discharged to:: Private residence Living Arrangements: Alone Available Help at Discharge: Family;Available PRN/intermittently Type of Home: House Home Access: Stairs to enter   Entrance Stairs-Number of Steps: 3 Home Layout: Two level;Able to live on main level with bedroom/bathroom Home Equipment: Gilford Rile - 2 wheels;Wheelchair - [employer];Shower seat      Prior Function Level of Independence: Independent with assistive device(s)         Comments: RW for mobility, independent with ADL     Hand Dominance   Dominant Hand: Right    Extremity/Trunk Assessment   Upper Extremity Assessment Upper Extremity Assessment: RUE deficits/detail;LUE deficits/detail RUE Deficits / Details: numbness in bil hands; present PTA RUE Sensation: decreased light touch RUE Coordination: decreased fine motor LUE Deficits / Details: numbness in bil hands; present PTA LUE Sensation: decreased light touch LUE Coordination: decreased fine motor    Lower Extremity Assessment Lower Extremity Assessment: LLE deficits/detail LLE Deficits / Details: asymmetrical weakness L>R. decreased coordination    Cervical / Trunk Assessment Cervical / Trunk Assessment: Other exceptions Cervical / Trunk Exceptions: s/p cervical sx  Communication   Communication: HOH  Cognition Arousal/Alertness: Awake/alert Behavior During Therapy: WFL for tasks  assessed/performed Overall Cognitive Status: Within  Functional Limits for tasks assessed                      General Comments      Exercises     Assessment/Plan    PT Assessment Patient needs continued PT services  PT Problem List Decreased strength;Decreased range of motion;Decreased activity tolerance;Decreased balance;Decreased mobility;Decreased coordination;Decreased knowledge of use of DME;Decreased safety awareness;Decreased knowledge of precautions;Pain          PT Treatment Interventions DME instruction;Gait training;Functional mobility training;Therapeutic activities;Therapeutic exercise;Balance training;Patient/family education    PT Goals (Current goals can be found in the Care Plan section)  Acute Rehab PT Goals Patient Stated Goal: rehab then home PT Goal Formulation: With patient/family Time For Goal Achievement: 02/11/16 Potential to Achieve Goals: Good    Frequency Min 3X/week   Barriers to discharge        Co-evaluation   Reason for Co-Treatment: For patient/therapist safety   OT goals addressed during session: ADL's and self-care       End of Session Equipment Utilized During Treatment: Gait belt;Cervical collar Activity Tolerance: Patient tolerated treatment well Patient left: in chair;with call bell/phone within reach;with chair alarm set Nurse Communication: Mobility status         Time: 1388-7195 PT Time Calculation (min) (ACUTE ONLY): 29 min   Charges:   PT Evaluation $PT Eval Moderate Complexity: 1 Procedure     PT G Codes:        Duncan Dull 02/08/16, 4:57 PM Alben Deeds, [place] DPT  [phone]

## 2016-01-28 NOTE — Progress Notes (Signed)
PULMONARY / CRITICAL CARE MEDICINE   Name: [name] MRN: [id number] DOB: 06/03/1933    ADMISSION DATE:  01/24/2016 CONSULTATION DATE:  1/11  REFERRING MD:  Lynann Bologna (ortho)   CHIEF COMPLAINT:  Respiratory failure   Brief:    83yo male with hx HTN, prostate cancer, chronic back pain admitted 1/10 for elective 2-stage cervical decompression and C3-C7 fusion.  Underwent first stage (anterior approach C3-C4) 1/10 without difficulty.  Returned to OR 1/11 for second phase (posterior C4-C7) with some c/o SOB.  Extubated in OR but required re-intubation in PACU and PCCM consulted for ICU admit and vent management.  Per Anesthesia report to Dr Chase Caller - vocal cord edema noticed by relatively stratighforward reintubation.   SIGNIFICANT EVENTS: 01/10 > part 1 surgery, anterior decompression / fusion. 01/11 > part 2, posterior.  LINES/TUBES: ETT 1/11>> 1/13   SUBJECTIVE:  01/28/2016  Extubated yesterday , did very well.  Up in chair , alert/f/c  ST following with MBS today .    VITAL SIGNS: BP (!) 164/95   Pulse 98   Temp 98.8 F (37.1 C) (Axillary)   Resp 16   Ht 6' (1.829 m)   Wt 82.6 kg (182 lb 1.6 oz)   SpO2 98%   BMI 24.70 kg/m   HEMODYNAMICS:    VENTILATOR SETTINGS:    INTAKE / OUTPUT: I/O last 3 completed shifts: In: 2132.3 [I.V.:2132.3] Out: 3151 [Urine:4545]  PHYSICAL EXAMINATION: GEN: A/Ox3; pleasant  NAD , up in chair    HEENT:  AT/[place] , nml voice   NECK : C. Collar in place. No stridor   RESP  CTA w/ o no wheezing   CARD:  RRR, no m/r/g  , no peripheral edema, pulses intact, no cyanosis or clubbing.  GI:   Soft & nt; nml bowel sounds; no organomegaly or masses detected.   Musco: Warm bil, no deformities or joint swelling noted. C Collar in place,   Neuro: alert, no focal deficits noted., MAEWx 4 , nml grips   Skin: Warm, no lesions or rashes   LABS: PULMONARY No results for input(s): PHART, PCO2ART, PO2ART, HCO3, TCO2, O2SAT in the last  168 hours.  Invalid input(s): PCO2, PO2  CBC  Recent Labs Lab 01/26/16 0321 01/27/16 0301 01/28/16 0237  HGB 12.7* 12.5* 12.3*  HCT 36.2* 35.3* 35.6*  WBC 11.5* 10.6* 9.2  PLT 107* 117* 141*    COAGULATION No results for input(s): INR in the last 168 hours.  CARDIAC  No results for input(s): TROPONINI in the last 168 hours. No results for input(s): PROBNP in the last 168 hours.   CHEMISTRY  Recent Labs Lab 01/25/16 1636 01/26/16 0321 01/27/16 0301 01/28/16 0237  NA 138 141 139 141  K 3.8 3.4* 3.9 2.9*  CL 107 105 103 105  CO2 '22 23 30 26  '$ GLUCOSE 128* 187* 151* 111*  BUN '9 8 12 14  '$ CREATININE 0.86 0.88 0.94 0.91  CALCIUM 9.0 9.2 9.2 9.3  MG  --  1.7 1.7  --   PHOS  --  2.5 2.0*  --    Estimated Creatinine Clearance: 71.1 mL/min (by C-G formula based on SCr of 0.91 mg/dL).   LIVER No results for input(s): AST, ALT, ALKPHOS, BILITOT, PROT, ALBUMIN, INR in the last 168 hours.   INFECTIOUS No results for input(s): LATICACIDVEN, PROCALCITON in the last 168 hours.   ENDOCRINE CBG (last 3)   Recent Labs  01/27/16 2310 01/28/16 0309 01/28/16 0707  GLUCAP 126* 117*  136*         IMAGING x48h  - image(s) personally visualized  -   highlighted in bold No results found.  DISCUSSION: 83yo male re-intubated post second phase of 2-phase (anterior and posterior approach) cervical decompression and fusion.    ASSESSMENT / PLAN:  NEUROLOGIC A:   Progressive cervical myelopathy with spinal cord compression - s/p 2 part surgery 01/24/16 and 01/25/16. Hx RLS, anxiety.  P:   Per Ortho   preadmission ropinirole, alprazolam on hold  Plan to d/c to rehab early next week per Primary team.   PULMONARY A: VDRF - due to failed extubation post op.- vocal cord edema reported>>resolved 1/13 , extubated w/out difficulty  P:   IS  Wean O2 to keep O2 sat >90%  CARDIOVASCULAR A:  Hx HTN, HLD. P:  Continue preadmission pravastatin,  fenofibrate. Resume preadmission labetolol  Restart losartan Josem Kaufmann as indicated. Marland Kitchen  RENAL A:   AKI. P:   d5 half NS @ 50 d/c once taking in oral  Trend BMP   GASTROINTESTINAL A:   GI prophylaxis. Nutrition. P:   D/c IV Pantoprazole. ST MBS pending -NPO.until ST recs   HEMATOLOGIC / ONCOLOGIC A:   Mild thrombocytopenia. VTE Prophylaxis. Hx prostate CA. P:  Monitor platelet counts. SCD's / heparin. Trend CBC   INFECTIOUS A:   No indication of infection. P:   Trend fever and wbc curve   ENDOCRINE A:   Hyperglycemia  P:   SSI Q4H glucose checks -d/c when taking in oral    Family updated: Family updated at bedside 1/13  Interdisciplinary Family Meeting v Palliative Care Meeting:  Due by: 1/18'/18    Tammy Parrett NP-C  [facility]  [phone]

## 2016-01-28 NOTE — Progress Notes (Signed)
PATIENT ID: ZEPHAN BEAUCHAINE  MRN: [id number]  DOB/AGE:  03-28-1933 / 83 y.o.  3 Days Post-Op Procedure(s) (LRB): POSTERIOR CERVICAL DECOMPRESSION FUSION, CERVICAL 3-4, CERVICAL 4-5, CERVICAL 5-6, CERVICAL 6-7 WITH INSTRUMENTATION AND ALLOGRAFT (N/A)    PROGRESS NOTE Subjective:   Patient is alert, oriented, no Nausea, no Vomiting,Taking PO with ice chips. Denies SOB, Chest or Calf Pain. Using Incentive Spirometer, PAS in place. Ambulate wbat , Patient reports pain as mild, states his neck and shoulders just feel tired.    Objective: Vital signs in last 24 hours: Temp:  [98.2 F (36.8 C)-99.1 F (37.3 C)] 98.8 F (37.1 C) (01/14 0749) Pulse Rate:  [49-103] 98 (01/14 1000) Resp:  [13-20] 16 (01/14 1000) BP: (130-170)/(64-104) 164/95 (01/14 1000) SpO2:  [94 %-100 %] 98 % (01/14 1000)    Intake/Output from previous day: I/O last 3 completed shifts: In: 2132.3 [I.V.:2132.3] Out: 9233 [Urine:4545]   Intake/Output this shift: Total I/O In: 156 [I.V.:156] Out: 275 [Urine:275]   LABORATORY DATA:  Recent Labs  01/27/16 0301  01/27/16 2310 01/28/16 0237 01/28/16 0309 01/28/16 0707  WBC 10.6*  --   --  9.2  --   --   HGB 12.5*  --   --  12.3*  --   --   HCT 35.3*  --   --  35.6*  --   --   PLT 117*  --   --  141*  --   --   NA 139  --   --  141  --   --   K 3.9  --   --  2.9*  --   --   CL 103  --   --  105  --   --   CO2 30  --   --  26  --   --   BUN 12  --   --  14  --   --   CREATININE 0.94  --   --  0.91  --   --   GLUCOSE 151*  --   --  111*  --   --   GLUCAP  --   < > 126*  --  117* 136*  CALCIUM 9.2  --   --  9.3  --   --   < > = values in this interval not displayed.  Examination: Neurologically intact Neurovascular intact Sensation intact distally Intact pulses distally Dorsiflexion/Plantar flexion intact}  Assessment:   3 Days Post-Op Procedure(s) (LRB): POSTERIOR CERVICAL DECOMPRESSION FUSION, CERVICAL 3-4, CERVICAL 4-5, CERVICAL 5-6, CERVICAL 6-7 WITH  INSTRUMENTATION AND ALLOGRAFT (N/A) ADDITIONAL DIAGNOSIS:  Hypertension and anxiety and depression  Plan:  Weight Bearing as Tolerated (WBAT)  DVT Prophylaxis:  Foot Pumps  DISCHARGE PLAN: Skilled Nursing Facility/Rehab when stable and bed available, likely Monday or Tuesday  Pt transferred to [facility] to begin PT/OT.    Continue to follow recommendations of speech pathologist.     Hermelinda Diegel R 01/28/2016, 11:07 AM

## 2016-01-28 NOTE — Evaluation (Signed)
Occupational Therapy Evaluation Patient Details Name: [name] MRN: [id number] DOB: 07/02/1933 Today's Date: 01/28/2016    History of Present Illness Pt is an 83 y.o. male s/p Posterior spinal fusion C3-7. PMHx: Anxiety, Cancer, Depression, Hx of vertigo, Hyperlipidemia, HTN, Restless leg, Weakness in bil hands.   Clinical Impression   Pt reports he was independent with ADL PTA. Currently pt overall mod assist +2 for sit to stand and min assist +2 for functional mobility. Pt requires min assist for UB ADL and mod assist for LB ADL. Recommending SNF for follow up to maximize independence and safety with ADL and functional mobility prior to return home. Pt would benefit from continued skilled OT to address established goals.    Follow Up Recommendations  SNF;Supervision/Assistance - 24 hour    Equipment Recommendations  Other (comment) (TBD at next venue)    Recommendations for Other Services       Precautions / Restrictions Precautions Precautions: Fall;Cervical Precaution Comments: Educated pt on cervical precautions Required Braces or Orthoses: Cervical Brace Cervical Brace: Hard collar Restrictions Weight Bearing Restrictions: No      Mobility Bed Mobility               General bed mobility comments: Pt OOB in chair upon arrival  Transfers Overall transfer level: Needs assistance Equipment used: Rolling walker (2 wheeled) Transfers: Sit to/from Stand Sit to Stand: Mod assist;+2 physical assistance         General transfer comment: Mod +2 for sit to stand from chair. Cues for hand placement and technique.    Balance Overall balance assessment: Needs assistance Sitting-balance support: Feet supported;Bilateral upper extremity supported Sitting balance-Leahy Scale: Fair     Standing balance support: Bilateral upper extremity supported Standing balance-Leahy Scale: Poor                              ADL Overall ADL's : Needs  assistance/impaired Eating/Feeding: Set up;Sitting;Supervision/ safety   Grooming: Set up;Supervision/safety;Sitting   Upper Body Bathing: Minimal assistance;Sitting   Lower Body Bathing: Moderate assistance;Sit to/from stand   Upper Body Dressing : Minimal assistance;Sitting   Lower Body Dressing: Moderate assistance;Sit to/from stand Lower Body Dressing Details (indicate cue type and reason): Pt able to cross foot over opposite. Toilet Transfer: Moderate assistance;+2 for physical assistance;Ambulation;RW Toilet Transfer Details (indicate cue type and reason): Simulated by sit to stand from chair with functional mobility. Min +2 for functional mobility.         Functional mobility during ADLs: Minimal assistance;+2 for safety/equipment;Rolling walker       Vision     Perception     Praxis      Pertinent Vitals/Pain Pain Assessment: Faces Faces Pain Scale: Hurts little more Pain Location: neck Pain Descriptors / Indicators: Sore Pain Intervention(s): Monitored during session     Hand Dominance Right   Extremity/Trunk Assessment Upper Extremity Assessment Upper Extremity Assessment: RUE deficits/detail;LUE deficits/detail RUE Deficits / Details: numbness in bil hands; present PTA RUE Sensation: decreased light touch RUE Coordination: decreased fine motor LUE Deficits / Details: numbness in bil hands; present PTA LUE Sensation: decreased light touch LUE Coordination: decreased fine motor   Lower Extremity Assessment Lower Extremity Assessment: LLE deficits/detail LLE Deficits / Details: asymmetrical weakness L>R. decreased coordination   Cervical / Trunk Assessment Cervical / Trunk Assessment: Other exceptions Cervical / Trunk Exceptions: s/p cervical sx   Communication Communication Communication: HOH   Cognition Arousal/Alertness: Awake/alert  Behavior During Therapy: WFL for tasks assessed/performed Overall Cognitive Status: Within Functional Limits for  tasks assessed                     General Comments       Exercises       Shoulder Instructions      Home Living Family/patient expects to be discharged to:: Private residence Living Arrangements: Alone Available Help at Discharge: Family;Available PRN/intermittently Type of Home: House Home Access: Stairs to enter Entrance Stairs-Number of Steps: 3   Home Layout: Two level;Able to live on main level with bedroom/bathroom     Bathroom Shower/Tub: Walk-in shower (handicapped accessible shower)   Bathroom Toilet: Handicapped height     Home Equipment: Environmental consultant - 2 wheels;Wheelchair - [employer];Shower seat          Prior Functioning/Environment Level of Independence: Independent with assistive device(s)        Comments: RW for mobility, independent with ADL        OT Problem List: Decreased strength;Impaired balance (sitting and/or standing);Decreased activity tolerance;Decreased coordination;Decreased safety awareness;Decreased knowledge of use of DME or AE;Decreased knowledge of precautions;Impaired sensation;Impaired UE functional use;Pain   OT Treatment/Interventions: Self-care/ADL training;Energy conservation;DME and/or AE instruction;Therapeutic activities;Patient/family education;Balance training    OT Goals(Current goals can be found in the care plan section) Acute Rehab OT Goals Patient Stated Goal: rehab then home OT Goal Formulation: With patient Time For Goal Achievement: 02/11/16 Potential to Achieve Goals: Good ADL Goals Pt Will Perform Grooming: with supervision;standing Pt Will Perform Lower Body Bathing: with supervision;sit to/from stand Pt Will Perform Lower Body Dressing: with supervision;sit to/from stand Pt Will Transfer to Toilet: with supervision;ambulating;bedside commode Pt Will Perform Toileting - Clothing Manipulation and hygiene: with supervision;sit to/from stand Additional ADL Goal #1: Pt will independently  verbally recall cervical precautions and maintain throughout ADL.  OT Frequency: Min 2X/week   Barriers to D/C: Decreased caregiver support  lives alone       Co-evaluation PT/OT/SLP Co-Evaluation/Treatment: Yes Reason for Co-Treatment: For patient/therapist safety   OT goals addressed during session: ADL's and self-care      End of Session Equipment Utilized During Treatment: Rolling walker;Cervical collar Nurse Communication: Mobility status  Activity Tolerance: Patient tolerated treatment well Patient left: in chair;with call bell/phone within reach;with chair alarm set   Time: 1456-1525 OT Time Calculation (min): 29 min Charges:  OT General Charges $OT Visit: 1 Procedure OT Evaluation $OT Eval Moderate Complexity: 1 Procedure G-Codes:     Binnie Kand M.S., OTR/L Pager: [phone]  01/28/2016, 3:33 PM

## 2016-01-28 NOTE — Progress Notes (Signed)
Modified Barium Swallow Progress Note  Patient Details  Name: LAVONTAE CORNIA MRN: [id number] Date of Birth: [birth year]  Today's Date: 01/28/2016  Modified Barium Swallow completed.  Full report located under Chart Review in the Imaging Section.  Brief recommendations include the following:  CHL IP CLINICAL IMPRESSIONS 01/28/2016  Therapy Diagnosis Moderate pharyngeal phase dysphagia  Clinical Impression Patient presents with moderate pharyngeal dysphagia in the setting of s/p ACDF, PCDF, and multiple intubations, as evidenced by trace silent aspiration of thin and nectar thick liquids. Deviations in swallow physiology included delayed swallow initiation at the level of the pyriform sinuses with thin and nectar thick liquids, lack of full epiglottic inversion with these consistencies 2/2 narrow pharyngeal space. Patient is able to clear aspiration with a cued cough/reswallow, however due to moderate-severe residual remaining in the valleculae and pyriform sinuses, subsequent penetration and eventual aspiration of residue was observed. Compensatory strategies limited by patient's positioning restrictions. Trialed supraglottic swallow which was ineffective for airway protection. Effortful swallow, dry swallows reduced but did not fully clear pharyngeal residue. Heavier boluses appear to facilitate full epiglottic inversion and airway protection. With honey-thick liquids, pureed and regular solids, timely initiation at the valleculae, no aspiration, penetration or abnormal pharyngeal residue noted. Recommend dysphagia 3 diet with honey-thick liquids, medications whole with puree. Patient may benefit from effortful swallow and intermittent dry swallows to ensure bolus clearance due to pharyngeal edema. SLP will f/u for diet tolerance.   Impact on safety and function Moderate aspiration risk     CHL IP TREATMENT RECOMMENDATION 01/28/2016  Treatment Recommendations Therapy as outlined in treatment plan  below     Prognosis 01/28/2016  Prognosis for Safe Diet Advancement Good  Barriers to Reach Goals --  Barriers/Prognosis Comment --    CHL IP DIET RECOMMENDATION 01/28/2016  SLP Diet Recommendations Dysphagia 3 (Mech soft) solids;Honey thick liquids  Liquid Administration via Cup  Medication Administration Whole meds with puree  Compensations Slow rate;Small sips/bites;Multiple dry swallows after each bite/sip;Effortful swallow  Postural Changes Seated upright at 90 degrees      CHL IP OTHER RECOMMENDATIONS 01/28/2016  Recommended Consults Consider ENT evaluation  Oral Care Recommendations Oral care BID  Other Recommendations Order thickener from pharmacy      CHL IP FOLLOW UP RECOMMENDATIONS 01/28/2016  Follow up Recommendations Skilled Nursing facility      [facility] IP FREQUENCY AND DURATION 01/28/2016  Speech Therapy Frequency (ACUTE ONLY) min 2x/week  Treatment Duration 2 weeks      Deneise Lever, MS CF-SLP Speech-Language Pathologist [phone]                          Aliene Altes 01/28/2016,5:52 PM

## 2016-01-28 NOTE — Progress Notes (Signed)
Message left to on call physicians office to notify potassium level.  Await phone call.

## 2016-01-28 NOTE — Consult Note (Signed)
Medical Consultation   [name]  EXB:[id number]  DOB: 1933-02-07  DOA: 01/24/2016  PCP: Bonnita Nasuti, MD   Outpatient Specialists:    Requesting physician: Dr Lynann Bologna, ortho  Reason for consultation: Hypokalemia   History of Present Illness:   [name] is an 83 y.o. male who was hospitalised elective two-stage cervical Decompression and Fusion, post op day 6.  Hospitalist team consulted to assist with management of hypokalemia.  He has multiple complicated medical history, including but not limited to HTN, prostate cancer, chronic back pain admitted on 01/24/2016 for elective 2-stage cervical decompression and C3-C7 fusion. He underwent first stage (anterior approach C3-C4) on 01/24/2016 without difficulty, and underwent the second phase (posterior C4-C7 the next day. He required reintubation and management for vent-dependent respiratory failure with focal cord edema, for which reason he was followed by CCM and eventually was weaned and extubated successfully.  Today is postop day 6 and currently on the Med-Surg unit, progress fairly well. He denies chest pain, shortness of breath, no lower extremity pain, no palpitations, no dizziness.  He mentioned that his sugars have been high but denies any previous history of that his mellitus.   Review of Systems:  ROS As per HPI otherwise 10 point review of systems negative.    Past Medical History: Past Medical History:  Diagnosis Date  . Anxiety    takes Xanax nightly  . Arthritis   . Cancer [facility])    prostate  . Chronic neck pain   . Depression    wife and son died last yr. Not on any meds  . History of colon polyps    "pre cancerous"  . History of vertigo   . HOH (hard of hearing)   . Hyperlipidemia    takes Pravastatin and Fenofibrate daily  . Hypertension    takes Losartan,Labetalol,and Amlodipine daily  . Restless leg   . Urinary frequency   . Varicose veins   . Weakness    numbness in both hands    Past Surgical History: Past Surgical History:  Procedure Laterality Date  . ANTERIOR CERVICAL DECOMP/DISCECTOMY FUSION N/A 01/24/2016   Procedure: ANTERIOR CERVICAL DECOMPRESSION FUSION CERVICAL 3-4 INSTRUMENTATION AND ALLOGRAFT;  Surgeon: Phylliss Bob, MD;  Location: [place];  Service: Orthopedics;  Laterality: N/A;  ANTERIOR CERVICAL DECOMPRESSION FUSION CERVICAL 3-4 INSTRUMENTATION AND ALLOGRAFT  . BACK SURGERY    . CARPAL TUNNEL RELEASE Left 2017  . COLONOSCOPY    . EYE SURGERY Bilateral    cataract removal  . HERNIA REPAIR    . INNER EAR SURGERY Left    busted ear drum  . JOINT REPLACEMENT Left    knee replacement  . penile prothesis    . POSTERIOR CERVICAL FUSION/FORAMINOTOMY N/A 01/25/2016   Procedure: POSTERIOR CERVICAL DECOMPRESSION FUSION, CERVICAL 3-4, CERVICAL 4-5, CERVICAL 5-6, CERVICAL 6-7 WITH INSTRUMENTATION AND ALLOGRAFT;  Surgeon: Phylliss Bob, MD;  Location: [place];  Service: Orthopedics;  Laterality: N/A;  POSTERIOR CERVICAL DECOMPRESSION FUSION, CERVICAL 3-4, CERVICAL 4-5, CERVICAL 5-6, CERVICAL 6-7 WITH INSTRUMENTATION AND ALLOGRAFT  . PROSTATE SURGERY    . REVERSE SHOULDER ARTHROPLASTY Left 05/04/2015   Procedure: LEFT REVERSE SHOULDER ARTHROPLASTY;  Surgeon: Justice Britain, MD;  Location: [place];  Service: Orthopedics;  Laterality: Left;     Allergies:   Allergies  Allergen Reactions  . Penicillins Rash    Has patient had a PCN reaction causing immediate rash, facial/tongue/throat  swelling, SOB or lightheadedness with hypotension:unsure Has patient had a PCN reaction causing severe rash involving mucus membranes or skin necrosis:unsure Has patient had a PCN reaction that required hospitalization:No Has patient had a PCN reaction occurring within the last 10 years:Yes If all of the above answers are "NO", then may proceed with Cephalosporin use.      Social History:  reports that he has quit smoking. His smokeless tobacco use  includes Chew. He reports that he does not drink alcohol or use drugs.   Family History: History reviewed. No pertinent family history.    Physical Exam: Vitals:   01/28/16 1000 01/28/16 1100 01/28/16 1344 01/28/16 1703  BP: (!) 164/95 (!) 155/75 (!) 123/54 129/70  Pulse: 98 91 81 92  Resp: '16 18 20 20  '$ Temp:   99.3 F (37.4 C) 98.9 F (37.2 C)  TempSrc:   Oral Oral  SpO2: 98% 97% 98% 95%  Weight:      Height:        Constitutional: Appearance,  Alert and awake, oriented x3, not in any acute distress. Eyes: PERLA, EOMI, irises appear normal, anicteric sclera,  ENMT: external ears and nose appear normal.           Lips appears normal, oropharynx mucosa, tongue, posterior pharynx appear normal  Neck: neck  Collar noted,  no JVD  CVS: S1-S2 clear, no murmur rubs or gallops, no LE edema, normal pedal pulses  Respiratory:  clear to auscultation bilaterally, no wheezing, rales or rhonchi. Respiratory effort normal. No accessory muscle use.  Abdomen: soft nontender, nondistended, normal bowel sounds, no hepatosplenomegaly, no hernias  Musculoskeletal: : no cyanosis, clubbing or edema noted bilaterally                        Neuro: Alert oriented x 3. Psych: judgement and insight appear normal, stable mood and affect, mental status Skin: no rashes or lesions or ulcers, no induration or nodules     Data reviewed:  I have personally reviewed following labs and imaging studies Labs:  CBC:  Recent Labs Lab 01/25/16 1636 01/26/16 0321 01/27/16 0301 01/28/16 0237  WBC 9.7 11.5* 10.6* 9.2  HGB 12.1* 12.7* 12.5* 12.3*  HCT 35.4* 36.2* 35.3* 35.6*  MCV 97.3 96.0 95.9 96.0  PLT 124* 107* 117* 141*    Basic Metabolic Panel:  Recent Labs Lab 01/25/16 1636 01/26/16 0321 01/27/16 0301 01/28/16 0237  NA 138 141 139 141  K 3.8 3.4* 3.9 2.9*  CL 107 105 103 105  CO2 '22 23 30 26  '$ GLUCOSE 128* 187* 151* 111*  BUN '9 8 12 14  '$ CREATININE 0.86 0.88 0.94 0.91  CALCIUM 9.0 9.2  9.2 9.3  MG  --  1.7 1.7  --   PHOS  --  2.5 2.0*  --    GFR Estimated Creatinine Clearance: 71.1 mL/min (by C-G formula based on SCr of 0.91 mg/dL). Liver Function Tests: No results for input(s): AST, ALT, ALKPHOS, BILITOT, PROT, ALBUMIN in the last 168 hours. No results for input(s): LIPASE, AMYLASE in the last 168 hours. No results for input(s): AMMONIA in the last 168 hours. Coagulation profile No results for input(s): INR, PROTIME in the last 168 hours.  Cardiac Enzymes: No results for input(s): CKTOTAL, CKMB, CKMBINDEX, TROPONINI in the last 168 hours. BNP: Invalid input(s): POCBNP CBG:  Recent Labs Lab 01/27/16 2310 01/28/16 0309 01/28/16 0707 01/28/16 1423 01/28/16 1617  GLUCAP 126* 117* 136* 167* 180*  D-Dimer No results for input(s): DDIMER in the last 72 hours. Hgb A1c No results for input(s): HGBA1C in the last 72 hours. Lipid Profile  Recent Labs  01/28/16 0237  TRIG 143   Thyroid function studies No results for input(s): TSH, T4TOTAL, T3FREE, THYROIDAB in the last 72 hours.  Invalid input(s): FREET3 Anemia work up No results for input(s): VITAMINB12, FOLATE, FERRITIN, TIBC, IRON, RETICCTPCT in the last 72 hours. Urinalysis    Component Value Date/Time   COLORURINE AMBER (A) 01/16/2016 0852   APPEARANCEUR HAZY (A) 01/16/2016 0852   LABSPEC 1.017 01/16/2016 0852   PHURINE 5.0 01/16/2016 0852   GLUCOSEU NEGATIVE 01/16/2016 0852   HGBUR NEGATIVE 01/16/2016 0852   BILIRUBINUR NEGATIVE 01/16/2016 0852   KETONESUR NEGATIVE 01/16/2016 0852   PROTEINUR NEGATIVE 01/16/2016 0852   UROBILINOGEN 0.2 10/24/2009 1257   NITRITE NEGATIVE 01/16/2016 0852   LEUKOCYTESUR NEGATIVE 01/16/2016 0852     Sepsis Labs Invalid input(s): PROCALCITONIN,  WBC,  LACTICIDVEN Microbiology No results found for this or any previous visit (from the past 240 hour(s)).     Inpatient Medications:   Scheduled Meds: . chlorhexidine  15 mL Mouth Rinse BID  .  cholecalciferol  1,000 Units Oral BID  . docusate sodium  100 mg Oral BID  . fenofibrate  160 mg Per Tube Daily  . insulin aspart  2-6 Units Subcutaneous Q4H  . labetalol  200 mg Oral BID  . mouth rinse  15 mL Mouth Rinse q12n4p  . multivitamin with minerals  1 tablet Oral Daily  . [START ON 01/29/2016] potassium chloride  20 mEq Oral q morning - 10a  . potassium chloride (KCL MULTIRUN) 30 mEq in 265 mL IVPB  30 mEq Intravenous Once  . potassium chloride  40 mEq Oral Once  . pravastatin  40 mg Per Tube QHS  . sodium chloride flush  3 mL Intravenous Q12H   Continuous Infusions: . sodium chloride       Radiological Exams on Admission: Dg Swallowing Func-speech Pathology  Result Date: 01/28/2016 Objective Swallowing Evaluation: Type of Study: MBS-Modified Barium Swallow Study Patient Details Name: [name] MRN: [id number] Date of Birth: [birth year] Today's Date: 01/28/2016 Time: SLP Start Time (ACUTE ONLY): 1220-SLP Stop Time (ACUTE ONLY): 1300 SLP Time Calculation (min) (ACUTE ONLY): 40 min Past Medical History: Past Medical History: Diagnosis Date . Anxiety   takes Xanax nightly . Arthritis  . Cancer [facility])   prostate . Chronic neck pain  . Depression   wife and son died last yr. Not on any meds . History of colon polyps   "pre cancerous" . History of vertigo  . HOH (hard of hearing)  . Hyperlipidemia   takes Pravastatin and Fenofibrate daily . Hypertension   takes Losartan,Labetalol,and Amlodipine daily . Restless leg  . Urinary frequency  . Varicose veins  . Weakness   numbness in both hands Past Surgical History: Past Surgical History: Procedure Laterality Date . ANTERIOR CERVICAL DECOMP/DISCECTOMY FUSION N/A 01/24/2016  Procedure: ANTERIOR CERVICAL DECOMPRESSION FUSION CERVICAL 3-4 INSTRUMENTATION AND ALLOGRAFT;  Surgeon: Phylliss Bob, MD;  Location: [place];  Service: Orthopedics;  Laterality: N/A;  ANTERIOR CERVICAL DECOMPRESSION FUSION CERVICAL 3-4 INSTRUMENTATION AND ALLOGRAFT . BACK  SURGERY   . CARPAL TUNNEL RELEASE Left 2017 . COLONOSCOPY   . EYE SURGERY Bilateral   cataract removal . HERNIA REPAIR   . INNER EAR SURGERY Left   busted ear drum . JOINT REPLACEMENT Left   knee replacement . penile prothesis   .  POSTERIOR CERVICAL FUSION/FORAMINOTOMY N/A 01/25/2016  Procedure: POSTERIOR CERVICAL DECOMPRESSION FUSION, CERVICAL 3-4, CERVICAL 4-5, CERVICAL 5-6, CERVICAL 6-7 WITH INSTRUMENTATION AND ALLOGRAFT;  Surgeon: Phylliss Bob, MD;  Location: [place];  Service: Orthopedics;  Laterality: N/A;  POSTERIOR CERVICAL DECOMPRESSION FUSION, CERVICAL 3-4, CERVICAL 4-5, CERVICAL 5-6, CERVICAL 6-7 WITH INSTRUMENTATION AND ALLOGRAFT . PROSTATE SURGERY   . REVERSE SHOULDER ARTHROPLASTY Left 05/04/2015  Procedure: LEFT REVERSE SHOULDER ARTHROPLASTY;  Surgeon: Justice Britain, MD;  Location: [place];  Service: Orthopedics;  Laterality: Left; HPI: [name] a 83 y.o.malewho presents with ongoing and progressive deterioration in balance and fine motor skills. 83yo male with hx HTN, prostate cancer, chronic back pain admitted 1/10 for elective 2-stage cervical decompression and C3-C7 fusion.  Underwent first stage (anterior approach C3-C4) 1/10 without difficulty.  Returned to OR 1/11 for second phase (posterior C4-C7) with some c/o SOB.  Extubated in OR but required re-intubation in PACU and PCCM consulted for ICU admit and vent management.  Per Anesthesia report to Dr Chase Caller - vocal cord edema noticed by relatively stratighforward reintubation.  Subjective: alert, cooperative Assessment / Plan / Recommendation CHL IP CLINICAL IMPRESSIONS 01/28/2016 Therapy Diagnosis Moderate pharyngeal phase dysphagia Clinical Impression Patient presents with moderate pharyngeal dysphagia in the setting of s/p ACDF, PCDF, and multiple intubations, as evidenced by trace silent aspiration of thin and nectar thick liquids. Deviations in swallow physiology included delayed swallow initiation at the level of the pyriform  sinuses with thin and nectar thick liquids, lack of full epiglottic inversion with these consistencies 2/2 narrow pharyngeal space. Patient is able to clear aspiration with a cued cough/reswallow, however due to moderate-severe residual remaining in the valleculae and pyriform sinuses, subsequent penetration and eventual aspiration of residue was observed. Compensatory strategies limited by patient's positioning restrictions. Trialed supraglottic swallow which was ineffective for airway protection. Effortful swallow, dry swallows reduced but did not fully clear pharyngeal residue. Heavier boluses appear to facilitate full epiglottic inversion and airway protection. With honey-thick liquids, pureed and regular solids, timely initiation at the valleculae, no aspiration, penetration or abnormal pharyngeal residue noted. Recommend dysphagia 3 diet with honey-thick liquids, medications whole with puree. Patient may benefit from effortful swallow and intermittent dry swallows to ensure bolus clearance due to pharyngeal edema. SLP will f/u for diet tolerance.  Impact on safety and function Moderate aspiration risk   CHL IP TREATMENT RECOMMENDATION 01/28/2016 Treatment Recommendations Therapy as outlined in treatment plan below   Prognosis 01/28/2016 Prognosis for Safe Diet Advancement Good Barriers to Reach Goals -- Barriers/Prognosis Comment -- CHL IP DIET RECOMMENDATION 01/28/2016 SLP Diet Recommendations Dysphagia 3 (Mech soft) solids;Honey thick liquids Liquid Administration via Cup Medication Administration Whole meds with puree Compensations Slow rate;Small sips/bites;Multiple dry swallows after each bite/sip;Effortful swallow Postural Changes Seated upright at 90 degrees   CHL IP OTHER RECOMMENDATIONS 01/28/2016 Recommended Consults Consider ENT evaluation Oral Care Recommendations Oral care BID Other Recommendations Order thickener from pharmacy   CHL IP FOLLOW UP RECOMMENDATIONS 01/28/2016 Follow up Recommendations  Skilled Nursing facility   [facility] IP FREQUENCY AND DURATION 01/28/2016 Speech Therapy Frequency (ACUTE ONLY) min 2x/week Treatment Duration 2 weeks      CHL IP ORAL PHASE 01/28/2016 Oral Phase WFL Oral - Pudding Teaspoon -- Oral - Pudding Cup -- Oral - Honey Teaspoon -- Oral - Honey Cup -- Oral - Nectar Teaspoon -- Oral - Nectar Cup -- Oral - Nectar Straw -- Oral - Thin Teaspoon -- Oral - Thin Cup -- Oral - Thin Straw -- Oral -  Puree -- Oral - Mech Soft -- Oral - Regular -- Oral - Multi-Consistency -- Oral - Pill -- Oral Phase - Comment --  CHL IP PHARYNGEAL PHASE 01/28/2016 Pharyngeal Phase Impaired Pharyngeal- Pudding Teaspoon -- Pharyngeal -- Pharyngeal- Pudding Cup -- Pharyngeal -- Pharyngeal- Honey Teaspoon WFL Pharyngeal -- Pharyngeal- Honey Cup WFL Pharyngeal -- Pharyngeal- Nectar Teaspoon Delayed swallow initiation-pyriform sinuses;Penetration/Apiration after swallow;Trace aspiration;Pharyngeal residue - valleculae;Pharyngeal residue - pyriform;Reduced epiglottic inversion Pharyngeal Material enters airway, passes BELOW cords without attempt by patient to eject out (silent aspiration) Pharyngeal- Nectar Cup Delayed swallow initiation-pyriform sinuses;Penetration/Aspiration during swallow;Trace aspiration;Pharyngeal residue - valleculae;Pharyngeal residue - pyriform;Reduced epiglottic inversion Pharyngeal Material enters airway, passes BELOW cords without attempt by patient to eject out (silent aspiration) Pharyngeal- Nectar Straw -- Pharyngeal -- Pharyngeal- Thin Teaspoon Trace aspiration;Delayed swallow initiation-pyriform sinuses;Pharyngeal residue - valleculae;Penetration/Apiration after swallow;Penetration/Aspiration during swallow;Reduced epiglottic inversion Pharyngeal Material enters airway, passes BELOW cords without attempt by patient to eject out (silent aspiration) Pharyngeal- Thin Cup -- Pharyngeal -- Pharyngeal- Thin Straw Delayed swallow initiation-pyriform sinuses;Penetration/Apiration after  swallow;Trace aspiration;Pharyngeal residue - valleculae;Reduced epiglottic inversion Pharyngeal Material enters airway, passes BELOW cords without attempt by patient to eject out (silent aspiration) Pharyngeal- Puree WFL Pharyngeal -- Pharyngeal- Mechanical Soft -- Pharyngeal -- Pharyngeal- Regular WFL Pharyngeal -- Pharyngeal- Multi-consistency -- Pharyngeal -- Pharyngeal- Pill WFL Pharyngeal -- Pharyngeal Comment --  CHL IP CERVICAL ESOPHAGEAL PHASE 01/28/2016 Cervical Esophageal Phase -- Pudding Teaspoon -- Pudding Cup -- Honey Teaspoon -- Honey Cup -- Nectar Teaspoon -- Nectar Cup -- Nectar Straw -- Thin Teaspoon -- Thin Cup -- Thin Straw -- Puree -- Mechanical Soft -- Regular -- Multi-consistency -- Pill -- Cervical Esophageal Comment Tissue prominence ~C6/7 consistent with CP bar, did not appear to impact bolus flow with heavier boluses Deneise Lever, MS CF-SLP Speech-Language Pathologist [phone] No flowsheet data found. Aliene Altes 01/28/2016, 5:49 PM               Impression/Recommendations Active Problems:   Radiculopathy   Acute postoperative respiratory failure (HCC)   Protein-calorie malnutrition, severe  1. Electrolyte Imbalance/Hypokalemia with Hypophosphotemia: Potassium Repletion ; Check Phos with Mag level and replete appropriately as needed    2. Hyperglycemia:  Patient denies h/o DM. He has been on dextrose based IVF and currently on SSI. Switch IVF to non dextrose based and following cbg's, check A1C- consider DM med as needed  3. Thrombocytopenia: Mild, improving, no further w/u for now - follow clinically.  4. Hypertension: Systolic blood pressure readings are sub-optimal, but may be related to postoperative pain - follow clinically and consider further optimization as needed.  Thank you for this consultation.  Our [facility] hospitalist team will follow the patient with you.   Time Spent: 32 minutes.  OSEI-BONSU,Naliah Edd[place] M.D. [facility] 01/28/2016, 7:06  PM

## 2016-01-29 ENCOUNTER — Encounter (HOSPITAL_COMMUNITY): Payer: Self-pay

## 2016-01-29 DIAGNOSIS — R739 Hyperglycemia, unspecified: Secondary | ICD-10-CM

## 2016-01-29 DIAGNOSIS — I10 Essential (primary) hypertension: Secondary | ICD-10-CM

## 2016-01-29 DIAGNOSIS — M5412 Radiculopathy, cervical region: Principal | ICD-10-CM

## 2016-01-29 LAB — CBC
HEMATOCRIT: 32.2 % — AB (ref 39.0–52.0)
HEMOGLOBIN: 11.2 g/dL — AB (ref 13.0–17.0)
MCH: 33.3 pg (ref 26.0–34.0)
MCHC: 34.8 g/dL (ref 30.0–36.0)
MCV: 95.8 fL (ref 78.0–100.0)
Platelets: 148 10*3/uL — ABNORMAL LOW (ref 150–400)
RBC: 3.36 MIL/uL — AB (ref 4.22–5.81)
RDW: 14.1 % (ref 11.5–15.5)
WBC: 8 10*3/uL (ref 4.0–10.5)

## 2016-01-29 LAB — BASIC METABOLIC PANEL
ANION GAP: 8 (ref 5–15)
BUN: 16 mg/dL (ref 6–20)
CHLORIDE: 108 mmol/L (ref 101–111)
CO2: 26 mmol/L (ref 22–32)
Calcium: 9.2 mg/dL (ref 8.9–10.3)
Creatinine, Ser: 0.81 mg/dL (ref 0.61–1.24)
GFR calc Af Amer: >60 mL/min (ref >60)
GLUCOSE: 121 mg/dL — AB (ref 65–99)
POTASSIUM: 3.8 mmol/L (ref 3.5–5.1)
Sodium: 142 mmol/L (ref 135–145)

## 2016-01-29 LAB — GLUCOSE, CAPILLARY
GLUCOSE-CAPILLARY: 124 mg/dL — AB (ref 65–99)
GLUCOSE-CAPILLARY: 137 mg/dL — AB (ref 65–99)
GLUCOSE-CAPILLARY: 100 mg/dL — AB (ref 65–99)
Glucose-Capillary: 119 mg/dL — ABNORMAL HIGH (ref 65–99)
Glucose-Capillary: 219 mg/dL — ABNORMAL HIGH (ref 65–99)
Glucose-Capillary: 98 mg/dL (ref 65–99)

## 2016-01-29 LAB — MAGNESIUM: Magnesium: 1.7 mg/dL (ref 1.7–2.4)

## 2016-01-29 LAB — PHOSPHORUS: Phosphorus: 2.9 mg/dL (ref 2.5–4.6)

## 2016-01-29 MED ORDER — INSULIN ASPART 100 UNIT/ML ~~LOC~~ SOLN: 2.0000 [IU] | Freq: Three times a day (TID) | SUBCUTANEOUS | Status: DC

## 2016-01-29 NOTE — Care Management Important Message (Signed)
Important Message  Patient Details  Name: [name] MRN: [id number] Date of Birth: Jun 10, 1933   Medicare Important Message Given:  Yes    [name] [address]ine Circle 01/29/2016, 4:38 PM

## 2016-01-29 NOTE — Progress Notes (Signed)
PATIENT ID: [name]  MRN: [id number]  DOB/AGE:  [birth year] / 83 y.o.  4 Days Post-Op Procedure(s) (LRB): POSTERIOR CERVICAL DECOMPRESSION FUSION, CERVICAL 3-4, CERVICAL 4-5, CERVICAL 5-6, CERVICAL 6-7 WITH INSTRUMENTATION AND ALLOGRAFT (N/A)    PROGRESS NOTE Subjective:   Patient is alert, oriented, no Nausea, no Vomiting, yes passing gas, yes Bowel Movement. Taking PO solids with advancement to liquids as tolerated. Denies SOB, Chest or Calf Pain. Using Incentive Spirometer, PAS in place. Ambulate WBAT with pt up with therapy, Patient reports pain as mild.    Objective: Vital signs in last 24 hours: Temp:  [98 F (36.7 C)-99.3 F (37.4 C)] 98 F (36.7 C) (01/15 0949) Pulse Rate:  [81-106] 90 (01/15 0949) Resp:  [18-20] 20 (01/15 0949) BP: (118-157)/(52-75) 118/52 (01/15 0949) SpO2:  [94 %-98 %] 98 % (01/15 0949)    Intake/Output from previous day: I/O last 3 completed shifts: In: 1817.7 [I.V.:1817.7] Out: 1675 [Urine:1675]   Intake/Output this shift: Total I/O In: 360 [P.O.:360] Out: 300 [Urine:300]   LABORATORY DATA:  Recent Labs  01/28/16 0237  01/28/16 1814  01/29/16 0008 01/29/16 0334 01/29/16 0404 01/29/16 0804  WBC 9.2  --   --   --   --  8.0  --   --   HGB 12.3*  --   --   --   --  11.2*  --   --   HCT 35.6*  --   --   --   --  32.2*  --   --   PLT 141*  --   --   --   --  148*  --   --   NA 141  --  142  --   --  142  --   --   K 2.9*  --  2.9*  --   --  3.8  --   --   CL 105  --  105  --   --  108  --   --   CO2 26  --  28  --   --  26  --   --   BUN 14  --  18  --   --  16  --   --   CREATININE 0.91  --  1.06  --   --  0.81  --   --   GLUCOSE 111*  --  139*  --   --  121*  --   --   GLUCAP  --   < >  --   < > 119*  --  124* 137*  CALCIUM 9.3  --  9.4  --   --  9.2  --   --   < > = values in this interval not displayed.  Examination: Neurologically intact ABD soft Neurovascular intact Sensation intact distally Intact pulses  distally Dorsiflexion/Plantar flexion intact}  Assessment:   4 Days Post-Op Procedure(s) (LRB): POSTERIOR CERVICAL DECOMPRESSION FUSION, CERVICAL 3-4, CERVICAL 4-5, CERVICAL 5-6, CERVICAL 6-7 WITH INSTRUMENTATION AND ALLOGRAFT (N/A) ADDITIONAL DIAGNOSIS:  Hypertension and anxiety and depression  Plan:  Weight Bearing as Tolerated (WBAT)  DVT Prophylaxis:  Foot Pumps  DISCHARGE PLAN: Skilled Nursing Facility/Rehab , likely tomorrow Continue with PT/OT  Continue to follow recommendations of speech pathologist.  We appreciate the input of [facility] hospitalist with regards to managing the pt's hypokalemia.      Felice Hope R 01/29/2016, 10:08 AM

## 2016-01-29 NOTE — Progress Notes (Signed)
Nutrition Follow-up   INTERVENTION:  Provide snacks TID Monitor diet advancement   NUTRITION DIAGNOSIS:   Inadequate oral intake related to inability to eat as evidenced by NPO status.  Ongoing; diet advanced but PO intake remains inadequate  GOAL:   Patient will meet greater than or equal to 90% of their needs  unmet  MONITOR:   PO intake, Supplement acceptance, Diet advancement, Labs, I & O's  REASON FOR ASSESSMENT:   Ventilator    ASSESSMENT:   83yo male with hx HTN, prostate cancer, chronic back pain admitted 1/10 for elective 2-stage cervical decompression and C3-C7 fusion.  Underwent first stage (anterior approach C3-C4) 1/10 without difficulty.  Returned to OR 1/11 for second phase (posterior C4-C7) with some c/o SOB.  Extubated in OR but required re-intubation in PACU and PCCM consulted for ICU admit and vent management.  Per Anesthesia report to Dr Chase Caller - vocal cord edema noticed by relatively stratighforward reintubation.   Diet advanced on 1/14 in the afternoon. Pt states that he highly dislikes the honey-thick liquids and that is is spoiling his appetite. He is getting tired of sweet liquids and foods; he did not like Magic Cup ice cream. He states that he ate well at breakfast this morning, but ate very little for lunch. He is interested in receiving smaller portions of food at meals and snacks in between.   Labs reviewed.   Diet Order:  DIET DYS 3 Room service appropriate? Yes; Fluid consistency: Honey Thick  Skin:  Wound (see comment) (closed neck incision)  Last BM:  1/15  Height:   Ht Readings from Last 1 Encounters:  01/25/16 6' (1.829 m)    Weight:   Wt Readings from Last 1 Encounters:  01/24/16 182 lb 1.6 oz (82.6 kg)    Ideal Body Weight:  80.9 kg  BMI:  Body mass index is 24.7 kg/m.  Estimated Nutritional Needs:   Kcal:  2000-2400kcal/day   Protein:  91-108g/day   Fluid:  2L/day   EDUCATION NEEDS:   No education needs  identified at this time  Scarlette Ar RD, CSP, LDN Inpatient Clinical Dietitian Pager: [phone] After Hours Pager: [phone]

## 2016-01-29 NOTE — Care Management Note (Signed)
Case Management Note  Patient Details  Name: FERDINAND REVOIR MRN: [id number] Date of Birth: 01-29-1933  Subjective/Objective:                  Pt admitted on 01/24/16 for 2 stage spinal surgery.  PTA, pt resided at home. Plan is to discharge to SNF when medically stable.   Action/Plan:   Expected Discharge Date:  01/27/16               Expected Discharge Plan:  Skilled Nursing Facility  In-House Referral:  Clinical Social Work  Discharge planning Services  CM Consult  Post Acute Care Choice:    Choice offered to:     DME Arranged:    DME Agency:     HH Arranged:    [place] Agency:     Status of Service:  In process, will continue to follow  If discussed at Long Length of Stay Meetings, dates discussed:    Additional Comments:  Rolm Baptise, RN 01/29/2016, 10:18 AM

## 2016-01-29 NOTE — Clinical Social Work Note (Signed)
CSW confirmed that pt will be able to discharge to [facility] when stable. No discharge today, however possible for 01/30/2016. CSW will continue to follow.   Darden Dates, MSW, LCSW Clinical Social Worker  [phone]

## 2016-01-29 NOTE — Consult Note (Addendum)
PROGRESS NOTE/MEDICAL [address]   TIERNAN Archer   HLK:[id number] DOB: 1933-03-25  DOA: 01/24/2016 PCP: Bonnita Nasuti, MD   Brief Narrative:  [name] is an 83 y.o. male who was hospitalised elective two-stage cervical Decompression and Fusion, post op day 6. Hospitalist team consulted to assist with management of hypokalemia.  He has multiple complicated medical history, including but not limited to HTN, prostate cancer, chronic back pain admitted on 01/24/2016 for elective 2-stage cervical decompression and C3-C7 fusion. He underwent first stage (anterior approach C3-C4) on 01/24/2016 without difficulty, and underwent the second phase (posterior C4-C7 the next day. He required reintubation and management for vent-dependent respiratory failure with focal cord edema, for which reason he was followed by CCM and eventually was weaned and extubated successfully. Patient is postop day 6 and currently on the Med-Surg unit.  Subjective: Have no complaints today, denies chest pain, SOB, N/V and diarrhea. Patient report no Hx of DM - "he has been watching his diet at home, since his PCP told him that his sugars were borderline"   Assessment & Plan: Radiculopathy s/p cervical decompression 2 part surgery on 01/24/16 and 01/25/16 Management per ortho  Rehab per primary team   HTN - stable  Previously elevated likely due to pain  Continue current management   Hypokalemia - resolved with K replacement   Thrombocytopenia  Mild improving no further work up needed.  Recommend follow up in 1-2 weeks with CBC   Hyperglycemia - likely stress related vs use of D5  Patient report prior episodes monitored by his PCP with diet A1C pending - if above 7 will recommend start Metformin 500 mg BID and follow up with PCP   Thank you for allowing [place] to participate in the care of your patient. Medicine team will sign off    Objective: Vitals:   01/28/16 2107 01/29/16 0330  01/29/16 0614 01/29/16 0949  BP: (!) 157/74 (!) 144/58 (!) 141/72 (!) 118/52  Pulse: (!) 106 90 87 90  Resp: '18 20 18 20  '$ Temp: 99.1 F (37.3 C) 98.9 F (37.2 C) 98.8 F (37.1 C) 98 F (36.7 C)  TempSrc: Oral Oral Oral Oral  SpO2: 94% 96% 98% 98%  Weight:      Height:        Intake/Output Summary (Last 24 hours) at 01/29/16 1137 Last data filed at 01/29/16 0949  Gross per 24 hour  Intake          1371.67 ml  Output              300 ml  Net          1071.67 ml   Filed Weights   01/24/16 1837  Weight: 82.6 kg (182 lb 1.6 oz)    Examination:  General exam: Appears calm and comfortable HEENT: Hard collar on neck  Respiratory system: Clear to auscultation. No wheezes,crackle or rhonchi Cardiovascular system: S1 & S2 heard, RRR. No JVD, murmurs, rubs or gallops Gastrointestinal system: Abdomen is nondistended, soft and nontender. Normal bowel sounds heard. Central nervous system: Alert and oriented. No focal neurological deficits. Extremities: No pedal edema.  Skin: No rashes, lesions or ulcers  Data Reviewed: I have personally reviewed following labs and imaging studies  CBC:  Recent Labs Lab 01/25/16 1636 01/26/16 0321 01/27/16 0301 01/28/16 0237 01/29/16 0334  WBC 9.7 11.5* 10.6* 9.2 8.0  HGB 12.1* 12.7* 12.5* 12.3* 11.2*  HCT 35.4* 36.2* 35.3* 35.6* 32.2*  MCV 97.3 96.0  95.9 96.0 95.8  PLT 124* 107* 117* 141* 093*   Basic Metabolic Panel:  Recent Labs Lab 01/26/16 0321 01/27/16 0301 01/28/16 0237 01/28/16 1814 01/28/16 2045 01/29/16 0334  NA 141 139 141 142  --  142  K 3.4* 3.9 2.9* 2.9*  --  3.8  CL 105 103 105 105  --  108  CO2 '23 30 26 28  '$ --  26  GLUCOSE 187* 151* 111* 139*  --  121*  BUN '8 12 14 18  '$ --  16  CREATININE 0.88 0.94 0.91 1.06  --  0.81  CALCIUM 9.2 9.2 9.3 9.4  --  9.2  MG 1.7 1.7  --   --  1.9 1.7  PHOS 2.5 2.0*  --   --   --  2.9   GFR: Estimated Creatinine Clearance: 79.8 mL/min (by C-G formula based on SCr of 0.81  mg/dL). Liver Function Tests: No results for input(s): AST, ALT, ALKPHOS, BILITOT, PROT, ALBUMIN in the last 168 hours. No results for input(s): LIPASE, AMYLASE in the last 168 hours. No results for input(s): AMMONIA in the last 168 hours. Coagulation Profile: No results for input(s): INR, PROTIME in the last 168 hours. Cardiac Enzymes: No results for input(s): CKTOTAL, CKMB, CKMBINDEX, TROPONINI in the last 168 hours. BNP (last 3 results) No results for input(s): PROBNP in the last 8760 hours. HbA1C: No results for input(s): HGBA1C in the last 72 hours. CBG:  Recent Labs Lab 01/28/16 1959 01/29/16 0008 01/29/16 0404 01/29/16 0804 01/29/16 1101  GLUCAP 162* 119* 124* 137* 219*   Lipid Profile:  Recent Labs  01/28/16 0237  TRIG 143   Thyroid Function Tests: No results for input(s): TSH, T4TOTAL, FREET4, T3FREE, THYROIDAB in the last 72 hours. Anemia Panel: No results for input(s): VITAMINB12, FOLATE, FERRITIN, TIBC, IRON, RETICCTPCT in the last 72 hours. Sepsis Labs: No results for input(s): PROCALCITON, LATICACIDVEN in the last 168 hours.  No results found for this or any previous visit (from the past 240 hour(s)).       Radiology Studies: Dg Swallowing Func-speech Pathology  Result Date: 01/28/2016 Objective Swallowing Evaluation: Type of Study: MBS-Modified Barium Swallow Study Patient Details Name: [name] MRN: [id number] Date of Birth: 1933/03/10 Today's Date: 01/28/2016 Time: SLP Start Time (ACUTE ONLY): 1220-SLP Stop Time (ACUTE ONLY): 1300 SLP Time Calculation (min) (ACUTE ONLY): 40 min Past Medical History: Past Medical History: Diagnosis Date . Anxiety   takes Xanax nightly . Arthritis  . Cancer [facility])   prostate . Chronic neck pain  . Depression   wife and son died last yr. Not on any meds . History of colon polyps   "pre cancerous" . History of vertigo  . HOH (hard of hearing)  . Hyperlipidemia   takes Pravastatin and Fenofibrate daily . Hypertension    takes Losartan,Labetalol,and Amlodipine daily . Restless leg  . Urinary frequency  . Varicose veins  . Weakness   numbness in both hands Past Surgical History: Past Surgical History: Procedure Laterality Date . ANTERIOR CERVICAL DECOMP/DISCECTOMY FUSION N/A 01/24/2016  Procedure: ANTERIOR CERVICAL DECOMPRESSION FUSION CERVICAL 3-4 INSTRUMENTATION AND ALLOGRAFT;  Surgeon: Phylliss Bob, MD;  Location: [place];  Service: Orthopedics;  Laterality: N/A;  ANTERIOR CERVICAL DECOMPRESSION FUSION CERVICAL 3-4 INSTRUMENTATION AND ALLOGRAFT . BACK SURGERY   . CARPAL TUNNEL RELEASE Left 2017 . COLONOSCOPY   . EYE SURGERY Bilateral   cataract removal . HERNIA REPAIR   . INNER EAR SURGERY Left   busted ear drum . JOINT  REPLACEMENT Left   knee replacement . penile prothesis   . POSTERIOR CERVICAL FUSION/FORAMINOTOMY N/A 01/25/2016  Procedure: POSTERIOR CERVICAL DECOMPRESSION FUSION, CERVICAL 3-4, CERVICAL 4-5, CERVICAL 5-6, CERVICAL 6-7 WITH INSTRUMENTATION AND ALLOGRAFT;  Surgeon: Phylliss Bob, MD;  Location: [place];  Service: Orthopedics;  Laterality: N/A;  POSTERIOR CERVICAL DECOMPRESSION FUSION, CERVICAL 3-4, CERVICAL 4-5, CERVICAL 5-6, CERVICAL 6-7 WITH INSTRUMENTATION AND ALLOGRAFT . PROSTATE SURGERY   . REVERSE SHOULDER ARTHROPLASTY Left 05/04/2015  Procedure: LEFT REVERSE SHOULDER ARTHROPLASTY;  Surgeon: Justice Britain, MD;  Location: [place];  Service: Orthopedics;  Laterality: Left; HPI: JARELL MCEWEN a 83 y.o.malewho presents with ongoing and progressive deterioration in balance and fine motor skills. 83yo male with hx HTN, prostate cancer, chronic back pain admitted 1/10 for elective 2-stage cervical decompression and C3-C7 fusion.  Underwent first stage (anterior approach C3-C4) 1/10 without difficulty.  Returned to OR 1/11 for second phase (posterior C4-C7) with some c/o SOB.  Extubated in OR but required re-intubation in PACU and PCCM consulted for ICU admit and vent management.  Per Anesthesia report to Dr  Chase Caller - vocal cord edema noticed by relatively stratighforward reintubation.  Subjective: alert, cooperative Assessment / Plan / Recommendation CHL IP CLINICAL IMPRESSIONS 01/28/2016 Therapy Diagnosis Moderate pharyngeal phase dysphagia Clinical Impression Patient presents with moderate pharyngeal dysphagia in the setting of s/p ACDF, PCDF, and multiple intubations, as evidenced by trace silent aspiration of thin and nectar thick liquids. Deviations in swallow physiology included delayed swallow initiation at the level of the pyriform sinuses with thin and nectar thick liquids, lack of full epiglottic inversion with these consistencies 2/2 narrow pharyngeal space. Patient is able to clear aspiration with a cued cough/reswallow, however due to moderate-severe residual remaining in the valleculae and pyriform sinuses, subsequent penetration and eventual aspiration of residue was observed. Compensatory strategies limited by patient's positioning restrictions. Trialed supraglottic swallow which was ineffective for airway protection. Effortful swallow, dry swallows reduced but did not fully clear pharyngeal residue. Heavier boluses appear to facilitate full epiglottic inversion and airway protection. With honey-thick liquids, pureed and regular solids, timely initiation at the valleculae, no aspiration, penetration or abnormal pharyngeal residue noted. Recommend dysphagia 3 diet with honey-thick liquids, medications whole with puree. Patient may benefit from effortful swallow and intermittent dry swallows to ensure bolus clearance due to pharyngeal edema. SLP will f/u for diet tolerance.  Impact on safety and function Moderate aspiration risk   CHL IP TREATMENT RECOMMENDATION 01/28/2016 Treatment Recommendations Therapy as outlined in treatment plan below   Prognosis 01/28/2016 Prognosis for Safe Diet Advancement Good Barriers to Reach Goals -- Barriers/Prognosis Comment -- CHL IP DIET RECOMMENDATION 01/28/2016 SLP Diet  Recommendations Dysphagia 3 (Mech soft) solids;Honey thick liquids Liquid Administration via Cup Medication Administration Whole meds with puree Compensations Slow rate;Small sips/bites;Multiple dry swallows after each bite/sip;Effortful swallow Postural Changes Seated upright at 90 degrees   CHL IP OTHER RECOMMENDATIONS 01/28/2016 Recommended Consults Consider ENT evaluation Oral Care Recommendations Oral care BID Other Recommendations Order thickener from pharmacy   CHL IP FOLLOW UP RECOMMENDATIONS 01/28/2016 Follow up Recommendations Skilled Nursing facility   [facility] IP FREQUENCY AND DURATION 01/28/2016 Speech Therapy Frequency (ACUTE ONLY) min 2x/week Treatment Duration 2 weeks      CHL IP ORAL PHASE 01/28/2016 Oral Phase WFL Oral - Pudding Teaspoon -- Oral - Pudding Cup -- Oral - Honey Teaspoon -- Oral - Honey Cup -- Oral - Nectar Teaspoon -- Oral - Nectar Cup -- Oral - Nectar Straw -- Oral - Thin Teaspoon --  Oral - Thin Cup -- Oral - Thin Straw -- Oral - Puree -- Oral - Mech Soft -- Oral - Regular -- Oral - Multi-Consistency -- Oral - Pill -- Oral Phase - Comment --  CHL IP PHARYNGEAL PHASE 01/28/2016 Pharyngeal Phase Impaired Pharyngeal- Pudding Teaspoon -- Pharyngeal -- Pharyngeal- Pudding Cup -- Pharyngeal -- Pharyngeal- Honey Teaspoon WFL Pharyngeal -- Pharyngeal- Honey Cup WFL Pharyngeal -- Pharyngeal- Nectar Teaspoon Delayed swallow initiation-pyriform sinuses;Penetration/Apiration after swallow;Trace aspiration;Pharyngeal residue - valleculae;Pharyngeal residue - pyriform;Reduced epiglottic inversion Pharyngeal Material enters airway, passes BELOW cords without attempt by patient to eject out (silent aspiration) Pharyngeal- Nectar Cup Delayed swallow initiation-pyriform sinuses;Penetration/Aspiration during swallow;Trace aspiration;Pharyngeal residue - valleculae;Pharyngeal residue - pyriform;Reduced epiglottic inversion Pharyngeal Material enters airway, passes BELOW cords without attempt by patient to eject  out (silent aspiration) Pharyngeal- Nectar Straw -- Pharyngeal -- Pharyngeal- Thin Teaspoon Trace aspiration;Delayed swallow initiation-pyriform sinuses;Pharyngeal residue - valleculae;Penetration/Apiration after swallow;Penetration/Aspiration during swallow;Reduced epiglottic inversion Pharyngeal Material enters airway, passes BELOW cords without attempt by patient to eject out (silent aspiration) Pharyngeal- Thin Cup -- Pharyngeal -- Pharyngeal- Thin Straw Delayed swallow initiation-pyriform sinuses;Penetration/Apiration after swallow;Trace aspiration;Pharyngeal residue - valleculae;Reduced epiglottic inversion Pharyngeal Material enters airway, passes BELOW cords without attempt by patient to eject out (silent aspiration) Pharyngeal- Puree WFL Pharyngeal -- Pharyngeal- Mechanical Soft -- Pharyngeal -- Pharyngeal- Regular WFL Pharyngeal -- Pharyngeal- Multi-consistency -- Pharyngeal -- Pharyngeal- Pill WFL Pharyngeal -- Pharyngeal Comment --  CHL IP CERVICAL ESOPHAGEAL PHASE 01/28/2016 Cervical Esophageal Phase -- Pudding Teaspoon -- Pudding Cup -- Honey Teaspoon -- Honey Cup -- Nectar Teaspoon -- Nectar Cup -- Nectar Straw -- Thin Teaspoon -- Thin Cup -- Thin Straw -- Puree -- Mechanical Soft -- Regular -- Multi-consistency -- Pill -- Cervical Esophageal Comment Tissue prominence ~C6/7 consistent with CP bar, did not appear to impact bolus flow with heavier boluses Deneise Lever, MS CF-SLP Speech-Language Pathologist [phone] No flowsheet data found. Aliene Altes 01/28/2016, 5:49 PM                 Scheduled Meds: . chlorhexidine  15 mL Mouth Rinse BID  . cholecalciferol  1,000 Units Oral BID  . docusate sodium  100 mg Oral BID  . fenofibrate  160 mg Per Tube Daily  . insulin aspart  2-6 Units Subcutaneous Q4H  . labetalol  200 mg Oral BID  . mouth rinse  15 mL Mouth Rinse q12n4p  . multivitamin with minerals  1 tablet Oral Daily  . potassium chloride  20 mEq Oral q morning - 10a  . pravastatin   40 mg Per Tube QHS  . sodium chloride flush  3 mL Intravenous Q12H   Continuous Infusions: . sodium chloride 50 mL/hr at 01/28/16 2021     LOS: 5 days    Chipper Oman, MD [facility] Pager [phone]  If 7PM-7AM, please contact night-coverage [email or web address] Password [facility] 01/29/2016, 11:37 AM

## 2016-01-30 LAB — HEMOGLOBIN A1C
HEMOGLOBIN A1C: 5.3 % (ref 4.8–5.6)
Mean Plasma Glucose: 105 mg/dL

## 2016-01-30 LAB — GLUCOSE, CAPILLARY
GLUCOSE-CAPILLARY: 115 mg/dL — AB (ref 65–99)
GLUCOSE-CAPILLARY: 152 mg/dL — AB (ref 65–99)
Glucose-Capillary: 115 mg/dL — ABNORMAL HIGH (ref 65–99)

## 2016-01-30 MED FILL — Thrombin For Soln 20000 Unit: CUTANEOUS | Qty: 1 | Status: AC

## 2016-01-30 NOTE — Progress Notes (Signed)
Speech Language Pathology Treatment: Dysphagia  Patient Details Name: [name] MRN: [id number] DOB: 06/12/1933 Today's Date: 01/30/2016 Time: 7078-6754 SLP Time Calculation (min) (ACUTE ONLY): 14 min  Assessment / Plan / Recommendation Clinical Impression  Pt consumed honey thick liquids with occasional delayed cough observed. SLP provided Min cues for recommended strategies to mitigate aspiration risk. He denied difficulty with meals, but does share his distaste for thickened liquids. Education provided about MBS results and rationale for current precautions. Pt will need continued f/u upon d/c to SNF.    HPI HPI: [name] a 83 y.o.malewho presents with ongoing and progressive deterioration in balance and fine motor skills. 83yo male with hx HTN, prostate cancer, chronic back pain admitted 1/10 for elective 2-stage cervical decompression and C3-C7 fusion.  Underwent first stage (anterior approach C3-C4) 1/10 without difficulty.  Returned to OR 1/11 for second phase (posterior C4-C7) with some c/o SOB.  Extubated in OR but required re-intubation in PACU and PCCM consulted for ICU admit and vent management.  Per Anesthesia report to Dr Chase Caller - vocal cord edema noticed by relatively stratighforward reintubation.       SLP Plan  Continue with current plan of care     Recommendations  Diet recommendations: Dysphagia 3 (mechanical soft);Honey-thick liquid Liquids provided via: Cup Medication Administration: Whole meds with puree Supervision: Patient able to self feed;Intermittent supervision to cue for compensatory strategies Compensations: Slow rate;Small sips/bites;Multiple dry swallows after each bite/sip;Effortful swallow Postural Changes and/or Swallow Maneuvers: Seated upright 90 degrees;Upright 30-60 min after meal                Oral Care Recommendations: Oral care BID Follow up Recommendations: Skilled Nursing facility Plan: Continue with current plan of  care       [name], [name] 01/30/2016, 11:23 AM  [name], M.A. CCC-SLP [phone]

## 2016-01-30 NOTE — Progress Notes (Signed)
Report given to [facility] RN.

## 2016-01-30 NOTE — Clinical Social Work Placement (Signed)
   CLINICAL SOCIAL WORK PLACEMENT  NOTE  Date:  01/30/2016  Patient Details  Name: [name] MRN: [id number] Date of Birth: [birth year]  Clinical Social Work is seeking post-discharge placement for this patient at the [place] level of care (*CSW will initial, date and re-position this form in  chart as items are completed):  Yes   Patient/family provided with [place] Work Department's list of facilities offering this level of care within the geographic area requested by the patient (or if unable, by the patient's family).  Yes   Patient/family informed of their freedom to choose among providers that offer the needed level of care, that participate in Medicare, Medicaid or managed care program needed by the patient, have an available bed and are willing to accept the patient.  Yes   Patient/family informed of 's ownership interest in [facility] and [facility], as well as of the fact that they are under no obligation to receive care at these facilities.  PASRR submitted to EDS on       PASRR number received on       Existing PASRR number confirmed on 01/24/16     FL2 transmitted to all facilities in geographic area requested by pt/family on 01/24/16     FL2 transmitted to all facilities within larger geographic area on       Patient informed that his/her managed care company has contracts with or will negotiate with certain facilities, including the following:        Yes   Patient/family informed of bed offers received.  Patient chooses bed at [place], [facility]     Physician recommends and patient chooses bed at      Patient to be transferred to [place] on 01/30/16.  Patient to be transferred to facility by [facility]     Patient family notified on 01/30/16 of transfer.  Name of family member notified:  pt will notify family.      PHYSICIAN       Additional Comment:     _______________________________________________ Darden Dates, LCSW 01/30/2016, 4:27 PM

## 2016-01-30 NOTE — Progress Notes (Signed)
Physical Therapy Treatment Patient Details Name: [name] MRN: [id number] DOB: 12/29/1933 Today's Date: 01/30/2016    History of Present Illness Pt is an 83 y.o. male s/p Posterior spinal fusion C3-7. PMHx: Anxiety, Cancer, Depression, Hx of vertigo, Hyperlipidemia, HTN, Restless leg, Weakness in bil hands.    PT Comments    Patient progressing with ambulation and transfers.  Feel balance issues continue to be a concern for d/c home with fall risk and lives alone.  Continue to recommend SNF rehab at d/c.   Follow Up Recommendations  SNF;Supervision/Assistance - 24 hour     Equipment Recommendations  None recommended by PT    Recommendations for Other Services       Precautions / Restrictions Precautions Precautions: Fall;Cervical Precaution Comments: reinforced education on cervical precautions Required Braces or Orthoses: Cervical Brace Cervical Brace: Hard collar    Mobility  Bed Mobility Overal bed mobility: Needs Assistance Bed Mobility: Rolling;Sidelying to Sit Rolling: Supervision Sidelying to sit: Supervision       General bed mobility comments: use of rail, cues for technique  Transfers Overall transfer level: Needs assistance Equipment used: Rolling walker (2 wheeled) Transfers: Sit to/from Stand Sit to Stand: Min assist         General transfer comment: up from EOB with cues, then from low chair with armrests with S  Ambulation/Gait Ambulation/Gait assistance: Min assist;+2 physical assistance Ambulation Distance (Feet): 100 Feet (x 2) Assistive device: Rolling walker (2 wheeled) Gait Pattern/deviations: Step-through pattern;Decreased stride length;Wide base of support;Decreased dorsiflexion - left;Decreased dorsiflexion - right     General Gait Details: sat to rest in hallway, cues for heel strike, assist for safety/balance and chair following   Stairs            Wheelchair Mobility    Modified Rankin (Stroke Patients Only)        Balance Overall balance assessment: Needs assistance Sitting-balance support: Feet supported Sitting balance-Leahy Scale: Good     Standing balance support: Bilateral upper extremity supported Standing balance-Leahy Scale: Poor Standing balance comment: UE support and assist for balance                    Cognition Arousal/Alertness: Awake/alert Behavior During Therapy: WFL for tasks assessed/performed Overall Cognitive Status: Within Functional Limits for tasks assessed                      Exercises      General Comments        Pertinent Vitals/Pain Pain Assessment: 0-10 Faces Pain Scale: No hurt    Home Living                      Prior Function            PT Goals (current goals can now be found in the care plan section) Progress towards PT goals: Progressing toward goals    Frequency    Min 5X/week      PT Plan Current plan remains appropriate;Frequency needs to be updated    Co-evaluation             End of Session Equipment Utilized During Treatment: Gait belt;Cervical collar Activity Tolerance: Patient tolerated treatment well Patient left: in chair;with call bell/phone within reach;with chair alarm set     Time: 1104-1130 PT Time Calculation (min) (ACUTE ONLY): 26 min  Charges:  $Gait Training: 23-37 mins  G CodesReginia Naas 01/30/2016, 12:13 PM Magda Kiel, [place] 01/30/2016

## 2016-01-30 NOTE — Clinical Social Work Note (Addendum)
Pt is ready for discharge to [place]. Pt shared that he will follow up with his family regarding leaving the hospital. Pt is aware and agreeable to discharge plan. Facility has received discharge information and is ready to admit pt. RN called report. [facility] will provide transportation. CSW is signing off as no further needs identified.   Darden Dates, MSW, LCSW Clinical Social Worker  [phone]

## 2016-01-30 NOTE — Progress Notes (Signed)
    Patient doing well, 5 Days PO ANT/POST cervical fusion. Pt doing well from surgical standpoint. Minimal neck pain, improved myelopathy symptomatolgy. Has not yet received philly/shower collar. PO complicated by upper airway swelling and re-intubation. Has been extubated and currently is on honey thick liquids and tolerating well, speech pathology not yet ready to progress diet. SNF placement has been set up for [facility]. [facility] can accept with dietary restriction. Pending Bed. Hypokalemia resolved and family medicine has signed off on patient.  Physical Exam: BP (!) 158/76 (BP Location: Right Arm)   Pulse 82   Temp 98.6 F (37 C) (Oral)   Resp 18   Ht 6' (1.829 m)   Wt 82.6 kg (182 lb 1.6 oz)   SpO2 96%   BMI 24.70 kg/m   Hard collar in place pt sitting up in chair comfortably. Collar removed and bulky bandages removed. Steri strips in place ant and post. No drainage or sign of infection. Neck soft and supple.  NVI  POD #5/6 s/p ANT/POST Cervical Fusion  - Improved myelopathy symptomatology, minimal PO neck pain - up with PT/OT, encourage ambulation - Percocet for pain, Valium for muscle spasms PRN - Hypokalemia resolved - Dysphagia secondary to PO edema  -extubated 01/28/2016  -Diet progressed to honey thick liquids and tolerating well  -Speech path rec staying with honey thick liquids for now with ongoing f/u - D/C to SNF when bed available  -Family medicine has signed off  -Speech path to cont following post D/C  -Ortho OK with D/C, F/U in office 2wks PO as scheduled    -Scripts and D/C instructions printed and in paper chart  -Philly collar to bedside, Ok to shower over steri strips with philly collar

## 2016-01-30 NOTE — Discharge Summary (Signed)
Patient ID: [name] MRN: [id number] DOB/AGE: 07-16-1933 83 y.o.  Admit date: 01/24/2016 Discharge date: 01/30/2016  Admission Diagnoses:  Active Problems:   Radiculopathy   Acute postoperative respiratory failure (HCC)   Protein-calorie malnutrition, severe   Discharge Diagnoses:  Same  Past Medical History:  Diagnosis Date  . Anxiety    takes Xanax nightly  . Arthritis   . Cancer [facility])    prostate  . Chronic neck pain   . Depression    wife and son died last yr. Not on any meds  . History of colon polyps    "pre cancerous"  . History of vertigo   . HOH (hard of hearing)   . Hyperlipidemia    takes Pravastatin and Fenofibrate daily  . Hypertension    takes Losartan,Labetalol,and Amlodipine daily  . Restless leg   . Urinary frequency   . Varicose veins   . Weakness    numbness in both hands    Surgeries: Procedure(s): POSTERIOR CERVICAL DECOMPRESSION FUSION, CERVICAL 3-4, CERVICAL 4-5, CERVICAL 5-6, CERVICAL 6-7 WITH INSTRUMENTATION AND ALLOGRAFT on 01/24/2016 - 01/25/2016   Consultants: Speech pathology and Family Medicine  Discharged Condition: Improved  Hospital Course: [name] is an 83 y.o. male who was admitted 01/24/2016 for operative treatment of myeloradiculopathy. Patient has severe unremitting pain that affects sleep, daily activities, and work/hobbies. After pre-op clearance the patient was taken to the operating room on 01/24/2016 - 01/25/2016 and underwent  Procedure(s): Anterior cervical decompression and fusion cervical 3-4 and POSTERIOR CERVICAL DECOMPRESSION FUSION, CERVICAL 3-4, CERVICAL 4-5, CERVICAL 5-6, CERVICAL 6-7 WITH INSTRUMENTATION AND ALLOGRAFT.    Patient was given perioperative antibiotics: Anti-infectives    Start     Dose/Rate Route Frequency Ordered Stop   01/25/16 2000  vancomycin (VANCOCIN) IVPB 1000 mg/200 mL premix     1,000 mg 200 mL/hr over 60 Minutes Intravenous  Once 01/25/16 1711 01/25/16 2133   01/25/16  0815  vancomycin (VANCOCIN) IVPB 1000 mg/200 mL premix     1,000 mg 200 mL/hr over 60 Minutes Intravenous  Once 01/25/16 0807 01/25/16 0900   01/25/16 0800  vancomycin (VANCOCIN) 1,250 mg in sodium chloride 0.9 % 250 mL IVPB  Status:  Discontinued     1,250 mg 166.7 mL/hr over 90 Minutes Intravenous  Once 01/25/16 0753 01/25/16 0806   01/25/16 0700  vancomycin (VANCOCIN) 1,250 mg in sodium chloride 0.9 % 250 mL IVPB  Status:  Discontinued     1,250 mg 166.7 mL/hr over 90 Minutes Intravenous To Short Stay 01/24/16 0911 01/24/16 1314   01/24/16 2000  vancomycin (VANCOCIN) 1,250 mg in sodium chloride 0.9 % 250 mL IVPB     1,250 mg 166.7 mL/hr over 90 Minutes Intravenous  Once 01/24/16 1318 01/25/16 0135   01/24/16 1130  sulfamethoxazole-trimethoprim (BACTRIM DS,SEPTRA DS) 800-160 MG per tablet 1 tablet  Status:  Discontinued     1 tablet Oral 2 times daily 01/24/16 1120 01/24/16 1320   01/24/16 0632  vancomycin (VANCOCIN) IVPB 1000 mg/200 mL premix     1,000 mg 200 mL/hr over 60 Minutes Intravenous On call to O.R. 01/24/16 0102 01/24/16 0925       Patient was given sequential compression devices, early ambulation to prevent DVT.  Patient benefited maximally from hospital stay and there were complications of PO dysphagia followed by speech pathology with good progression and hypokalemia resolved.    Recent vital signs: Patient Vitals for the past 24 hrs:  BP Temp Temp src  Pulse Resp SpO2  01/30/16 1028 (!) 158/76 98.6 F (37 C) Oral 82 18 96 %  01/30/16 0600 (!) 143/72 98.8 F (37.1 C) Oral 87 20 95 %  01/30/16 0158 (!) 162/81 98.7 F (37.1 C) Oral 87 20 95 %  01/29/16 2054 (!) 164/97 98 F (36.7 C) Oral 78 20 94 %  01/29/16 1656 (!) 162/74 99 F (37.2 C) Oral 88 20 96 %  01/29/16 1424 (!) 147/73 99.3 F (37.4 C) Oral 96 20 97 %     Discharge Medications:   Allergies as of 01/30/2016      Reactions   Penicillins Rash   Has patient had a PCN reaction causing immediate rash,  facial/tongue/throat swelling, SOB or lightheadedness with hypotension:unsure Has patient had a PCN reaction causing severe rash involving mucus membranes or skin necrosis:unsure Has patient had a PCN reaction that required hospitalization:No Has patient had a PCN reaction occurring within the last 10 years:Yes If all of the above answers are "NO", then may proceed with Cephalosporin use.      Medication List    TAKE these medications   albuterol 108 (90 Base) MCG/ACT inhaler Commonly known as:  PROVENTIL HFA;VENTOLIN HFA Inhale 1-2 puffs into the lungs every 6 (six) hours as needed (for shortness of breath/wheezing (scheduled at bedtime per patient)).   ALPRAZolam 0.25 MG tablet Commonly known as:  XANAX Take 0.25 mg by mouth at bedtime.   amLODipine 5 MG tablet Commonly known as:  NORVASC Take 5 mg by mouth daily.   cholecalciferol 1000 units tablet Commonly known as:  VITAMIN D Take 1,000 Units by mouth 2 (two) times daily.   fenofibrate micronized 200 MG capsule Commonly known as:  LOFIBRA Take 200 mg by mouth daily before breakfast.   labetalol 200 MG tablet Commonly known as:  NORMODYNE Take 200 mg by mouth 2 (two) times daily.   losartan 50 MG tablet Commonly known as:  COZAAR Take 50 mg by mouth at bedtime.   multivitamin with minerals Tabs tablet Take 1 tablet by mouth daily. Centrum Silver for Men   pravastatin 40 MG tablet Commonly known as:  PRAVACHOL Take 40 mg by mouth at bedtime.   rOPINIRole 0.25 MG tablet Commonly known as:  REQUIP Take 0.5-0.75 mg by mouth as directed. Take up to 3 tablet at night (patient typically takes 0.5 mg)   sulfamethoxazole-trimethoprim 800-160 MG tablet Commonly known as:  BACTRIM DS,SEPTRA DS Take 1 tablet by mouth 2 (two) times daily.   vitamin C 1000 MG tablet Take 1,000 mg by mouth at bedtime.       Diagnostic Studies: Dg Chest 2 View  Result Date: 01/16/2016 CLINICAL DATA:  Preop cervical surgery EXAM: CHEST   2 VIEW COMPARISON:  10/26/2007 FINDINGS: Large hiatal hernia. Heart is normal size. No confluent airspace opacities or effusions. No acute bony abnormality. IMPRESSION: Large hiatal hernia.  No active disease. Electronically Signed   By: Rolm Baptise M.D.   On: 01/16/2016 09:19   Dg Cervical Spine 1 View  Result Date: 01/25/2016 CLINICAL DATA:  Cervical spine fusion at C3-4, C4-5, and C5-6. EXAM: CERVICAL SPINE 1 VIEW COMPARISON:  CT cervical spine of 12/29/2015 FINDINGS: Cross-table lateral view of the cervical spine in the operating room labeled 1 shows anterior fusion at C3-4. Anterior metallic fixation plate is in good position with interbody fusion plug in good position. Alignment is normal. Clamps are noted on the posterior spinous processes of C4 and C5. IMPRESSION: Anterior fusion at  O1-3.  No complicating features. Electronically Signed   By: Ivar Drape M.D.   On: 01/25/2016 13:50   Dg Cervical Spine 1 View  Result Date: 01/24/2016 CLINICAL DATA:  Cervical fusion. EXAM: CERVICAL SPINE 1 VIEW COMPARISON:  None. FINDINGS: Lateral spot film of the cervical spine demonstrates anterior and interbody fusion changes CT C3-4. The hardware is in good position. No complicating features. IMPRESSION: C3-4 fusion. Electronically Signed   By: Marijo Sanes M.D.   On: 01/24/2016 10:35   Dg Cervical Spine 2-3 Views  Result Date: 01/25/2016 CLINICAL DATA:  Cervical fusion. EXAM: CERVICAL SPINE - 2-3 VIEW COMPARISON:  01/24/2016. FINDINGS: Posterior fusion from C3 through C7. Anterior and interbody fusion C3-C4. Hardware intact. Normal alignment. Two images obtained. 0 minutes 13 seconds fluoroscopy time. IMPRESSION: Postsurgical changes cervical spine. Electronically Signed   By: Marcello Moores  Register   On: 01/25/2016 12:52   Portable Chest Xray  Result Date: 01/26/2016 CLINICAL DATA:  Spine surgery. EXAM: PORTABLE CHEST 1 VIEW COMPARISON:  01/25/2016. FINDINGS: Endotracheal tube in stable position. Cardiomegaly  with pulmonary vascular prominence. Interim partial clearing of bilateral pulmonary interstitial prominence consistent partial clearing of pulmonary interstitial edema. Low lung volumes. Small left pleural effusion.No pneumothorax . IMPRESSION: 1. Endotracheal tube in stable position. 2. Congestive heart failure with partial clearing of pulmonary interstitial edema. Small left pleural effusion . Electronically Signed   By: Marcello Moores  Register   On: 01/26/2016 07:29   Portable Chest Xray  Result Date: 01/25/2016 CLINICAL DATA:  Intubated patient during cervical spine surgery. EXAM: PORTABLE CHEST 1 VIEW COMPARISON:  Chest x-ray of January 16, 2015 FINDINGS: There are new fluffy alveolar opacities bilaterally. The pulmonary vascularity is engorged. The cardiac silhouette is normal in size. There is no pneumothorax. There is no significant pleural effusion visible. There is calcification in the wall of the aortic arch. The endotracheal tube tip lies 4.4 cm above the carina. IMPRESSION: Bilateral alveolar opacities worrisome for pulmonary edema. Certainly pneumonia cannot be absolutely excluded. The endotracheal tube tip lies 4.4 cm above the carina. Thoracic aortic atherosclerosis. Electronically Signed   By: David  Martinique M.D.   On: 01/25/2016 16:06   Dg Swallowing Func-speech Pathology  Result Date: 01/28/2016 Objective Swallowing Evaluation: Type of Study: MBS-Modified Barium Swallow Study Patient Details Name: [name] MRN: [id number] Date of Birth: [birth year] Today's Date: 01/28/2016 Time: SLP Start Time (ACUTE ONLY): 1220-SLP Stop Time (ACUTE ONLY): 1300 SLP Time Calculation (min) (ACUTE ONLY): 40 min Past Medical History: Past Medical History: Diagnosis Date . Anxiety   takes Xanax nightly . Arthritis  . Cancer [facility])   prostate . Chronic neck pain  . Depression   wife and son died last yr. Not on any meds . History of colon polyps   "pre cancerous" . History of vertigo  . HOH (hard of hearing)  .  Hyperlipidemia   takes Pravastatin and Fenofibrate daily . Hypertension   takes Losartan,Labetalol,and Amlodipine daily . Restless leg  . Urinary frequency  . Varicose veins  . Weakness   numbness in both hands Past Surgical History: Past Surgical History: Procedure Laterality Date . ANTERIOR CERVICAL DECOMP/DISCECTOMY FUSION N/A 01/24/2016  Procedure: ANTERIOR CERVICAL DECOMPRESSION FUSION CERVICAL 3-4 INSTRUMENTATION AND ALLOGRAFT;  Surgeon: Phylliss Bob, MD;  Location: [place];  Service: Orthopedics;  Laterality: N/A;  ANTERIOR CERVICAL DECOMPRESSION FUSION CERVICAL 3-4 INSTRUMENTATION AND ALLOGRAFT . BACK SURGERY   . CARPAL TUNNEL RELEASE Left 2017 . COLONOSCOPY   . EYE SURGERY Bilateral   cataract  removal . HERNIA REPAIR   . INNER EAR SURGERY Left   busted ear drum . JOINT REPLACEMENT Left   knee replacement . penile prothesis   . POSTERIOR CERVICAL FUSION/FORAMINOTOMY N/A 01/25/2016  Procedure: POSTERIOR CERVICAL DECOMPRESSION FUSION, CERVICAL 3-4, CERVICAL 4-5, CERVICAL 5-6, CERVICAL 6-7 WITH INSTRUMENTATION AND ALLOGRAFT;  Surgeon: Phylliss Bob, MD;  Location: [place];  Service: Orthopedics;  Laterality: N/A;  POSTERIOR CERVICAL DECOMPRESSION FUSION, CERVICAL 3-4, CERVICAL 4-5, CERVICAL 5-6, CERVICAL 6-7 WITH INSTRUMENTATION AND ALLOGRAFT . PROSTATE SURGERY   . REVERSE SHOULDER ARTHROPLASTY Left 05/04/2015  Procedure: LEFT REVERSE SHOULDER ARTHROPLASTY;  Surgeon: Justice Britain, MD;  Location: [place];  Service: Orthopedics;  Laterality: Left; HPI: [name] a 83 y.o.malewho presents with ongoing and progressive deterioration in balance and fine motor skills. 83yo male with hx HTN, prostate cancer, chronic back pain admitted 1/10 for elective 2-stage cervical decompression and C3-C7 fusion.  Underwent first stage (anterior approach C3-C4) 1/10 without difficulty.  Returned to OR 1/11 for second phase (posterior C4-C7) with some c/o SOB.  Extubated in OR but required re-intubation in PACU and PCCM  consulted for ICU admit and vent management.  Per Anesthesia report to Dr Chase Caller - vocal cord edema noticed by relatively stratighforward reintubation.  Subjective: alert, cooperative Assessment / Plan / Recommendation CHL IP CLINICAL IMPRESSIONS 01/28/2016 Therapy Diagnosis Moderate pharyngeal phase dysphagia Clinical Impression Patient presents with moderate pharyngeal dysphagia in the setting of s/p ACDF, PCDF, and multiple intubations, as evidenced by trace silent aspiration of thin and nectar thick liquids. Deviations in swallow physiology included delayed swallow initiation at the level of the pyriform sinuses with thin and nectar thick liquids, lack of full epiglottic inversion with these consistencies 2/2 narrow pharyngeal space. Patient is able to clear aspiration with a cued cough/reswallow, however due to moderate-severe residual remaining in the valleculae and pyriform sinuses, subsequent penetration and eventual aspiration of residue was observed. Compensatory strategies limited by patient's positioning restrictions. Trialed supraglottic swallow which was ineffective for airway protection. Effortful swallow, dry swallows reduced but did not fully clear pharyngeal residue. Heavier boluses appear to facilitate full epiglottic inversion and airway protection. With honey-thick liquids, pureed and regular solids, timely initiation at the valleculae, no aspiration, penetration or abnormal pharyngeal residue noted. Recommend dysphagia 3 diet with honey-thick liquids, medications whole with puree. Patient may benefit from effortful swallow and intermittent dry swallows to ensure bolus clearance due to pharyngeal edema. SLP will f/u for diet tolerance.  Impact on safety and function Moderate aspiration risk   CHL IP TREATMENT RECOMMENDATION 01/28/2016 Treatment Recommendations Therapy as outlined in treatment plan below   Prognosis 01/28/2016 Prognosis for Safe Diet Advancement Good Barriers to Reach Goals --  Barriers/Prognosis Comment -- CHL IP DIET RECOMMENDATION 01/28/2016 SLP Diet Recommendations Dysphagia 3 (Mech soft) solids;Honey thick liquids Liquid Administration via Cup Medication Administration Whole meds with puree Compensations Slow rate;Small sips/bites;Multiple dry swallows after each bite/sip;Effortful swallow Postural Changes Seated upright at 90 degrees   CHL IP OTHER RECOMMENDATIONS 01/28/2016 Recommended Consults Consider ENT evaluation Oral Care Recommendations Oral care BID Other Recommendations Order thickener from pharmacy   CHL IP FOLLOW UP RECOMMENDATIONS 01/28/2016 Follow up Recommendations Skilled Nursing facility   [facility] IP FREQUENCY AND DURATION 01/28/2016 Speech Therapy Frequency (ACUTE ONLY) min 2x/week Treatment Duration 2 weeks      CHL IP ORAL PHASE 01/28/2016 Oral Phase WFL Oral - Pudding Teaspoon -- Oral - Pudding Cup -- Oral - Honey Teaspoon -- Oral - Honey Cup -- Oral -  Nectar Teaspoon -- Oral - Nectar Cup -- Oral - Nectar Straw -- Oral - Thin Teaspoon -- Oral - Thin Cup -- Oral - Thin Straw -- Oral - Puree -- Oral - Mech Soft -- Oral - Regular -- Oral - Multi-Consistency -- Oral - Pill -- Oral Phase - Comment --  CHL IP PHARYNGEAL PHASE 01/28/2016 Pharyngeal Phase Impaired Pharyngeal- Pudding Teaspoon -- Pharyngeal -- Pharyngeal- Pudding Cup -- Pharyngeal -- Pharyngeal- Honey Teaspoon WFL Pharyngeal -- Pharyngeal- Honey Cup WFL Pharyngeal -- Pharyngeal- Nectar Teaspoon Delayed swallow initiation-pyriform sinuses;Penetration/Apiration after swallow;Trace aspiration;Pharyngeal residue - valleculae;Pharyngeal residue - pyriform;Reduced epiglottic inversion Pharyngeal Material enters airway, passes BELOW cords without attempt by patient to eject out (silent aspiration) Pharyngeal- Nectar Cup Delayed swallow initiation-pyriform sinuses;Penetration/Aspiration during swallow;Trace aspiration;Pharyngeal residue - valleculae;Pharyngeal residue - pyriform;Reduced epiglottic inversion Pharyngeal  Material enters airway, passes BELOW cords without attempt by patient to eject out (silent aspiration) Pharyngeal- Nectar Straw -- Pharyngeal -- Pharyngeal- Thin Teaspoon Trace aspiration;Delayed swallow initiation-pyriform sinuses;Pharyngeal residue - valleculae;Penetration/Apiration after swallow;Penetration/Aspiration during swallow;Reduced epiglottic inversion Pharyngeal Material enters airway, passes BELOW cords without attempt by patient to eject out (silent aspiration) Pharyngeal- Thin Cup -- Pharyngeal -- Pharyngeal- Thin Straw Delayed swallow initiation-pyriform sinuses;Penetration/Apiration after swallow;Trace aspiration;Pharyngeal residue - valleculae;Reduced epiglottic inversion Pharyngeal Material enters airway, passes BELOW cords without attempt by patient to eject out (silent aspiration) Pharyngeal- Puree WFL Pharyngeal -- Pharyngeal- Mechanical Soft -- Pharyngeal -- Pharyngeal- Regular WFL Pharyngeal -- Pharyngeal- Multi-consistency -- Pharyngeal -- Pharyngeal- Pill WFL Pharyngeal -- Pharyngeal Comment --  CHL IP CERVICAL ESOPHAGEAL PHASE 01/28/2016 Cervical Esophageal Phase -- Pudding Teaspoon -- Pudding Cup -- Honey Teaspoon -- Honey Cup -- Nectar Teaspoon -- Nectar Cup -- Nectar Straw -- Thin Teaspoon -- Thin Cup -- Thin Straw -- Puree -- Mechanical Soft -- Regular -- Multi-consistency -- Pill -- Cervical Esophageal Comment Tissue prominence ~C6/7 consistent with CP bar, did not appear to impact bolus flow with heavier boluses Deneise Lever, MS CF-SLP Speech-Language Pathologist [phone] No flowsheet data found. Aliene Altes 01/28/2016, 5:49 PM              Dg C-arm 1-60 Min  Result Date: 01/25/2016 CLINICAL DATA:  Posterior cervical disc fusion. EXAM: DG C-ARM 61-120 MIN FLUOROSCOPY TIME:  Routine seconds. COMPARISON:  January 24, 2016. FINDINGS: Two intraoperative fluoroscopic images were obtained of the cervical spine. These demonstrate the patient to be status post surgical anterior  fusion of C3-4 with interbody fusion. The patient is now status post surgical posterior fusion extending from C3-C7, with bilateral intrapedicular screw placement present at C3, C4, C5 and C7. Good alignment of vertebral bodies is noted. IMPRESSION: Status post surgical posterior fusion extending from C3-C7. Electronically Signed   By: Marijo Conception, M.D.   On: 01/25/2016 12:53   Dg C-arm 1-60 Min  Result Date: 01/24/2016 CLINICAL DATA:  Cervical fusion. EXAM: CERVICAL SPINE 1 VIEW COMPARISON:  None. FINDINGS: Lateral spot film of the cervical spine demonstrates anterior and interbody fusion changes CT C3-4. The hardware is in good position. No complicating features. IMPRESSION: C3-4 fusion. Electronically Signed   By: Marijo Sanes M.D.   On: 01/24/2016 10:35    Disposition: 03-Skilled Nursing Facility  Discharge Instructions    Discharge patient    Complete by:  As directed    [facility] pending bed with dietary restrictions and ongoing speech path f/u   Discharge disposition:  03-Skilled Nursing Facility   Discharge patient date:  01/30/2016     POD #5/6  s/p ANT/POST Cervical Fusion  - Improved myelopathy symptomatology, minimal PO neck pain - up with PT/OT, encourage ambulation - Percocet for pain, Valium for muscle spasms PRN - Hypokalemia resolved - Dysphagia secondary to PO edema             -extubated 01/28/2016             -Diet progressed to honey thick liquids and tolerating well             -Speech path rec staying with honey thick liquids for now with ongoing f/u - D/C to SNF when bed available             -Family medicine has signed off             -Speech path to cont following post D/C             -Ortho OK with D/C, F/U in office 2wks PO as scheduled                          -Scripts and D/C instructions printed and in paper chart  -Philly collar to bedside, Ok to shower over steri strips with philly collar  Signed: Justice Britain 01/30/2016, 12:01 PM

## 2016-07-03 ENCOUNTER — Other Ambulatory Visit: Payer: Self-pay | Admitting: Orthopedic Surgery

## 2016-07-03 DIAGNOSIS — M545 Low back pain: Secondary | ICD-10-CM

## 2016-07-18 ENCOUNTER — Ambulatory Visit
Admission: RE | Admit: 2016-07-18 | Discharge: 2016-07-18 | Disposition: A | Discharge status: Home / Self Care | Payer: Medicare Other | Source: Ambulatory Visit | Attending: Orthopedic Surgery | Admitting: Orthopedic Surgery

## 2016-07-18 DIAGNOSIS — M545 Low back pain: Secondary | ICD-10-CM

## 2018-04-07 DIAGNOSIS — Z01818 Encounter for other preprocedural examination: Secondary | ICD-10-CM | POA: Diagnosis not present

## 2018-04-21 DIAGNOSIS — C679 Malignant neoplasm of bladder, unspecified: Secondary | ICD-10-CM | POA: Diagnosis not present

## 2018-04-23 ENCOUNTER — Telehealth: Payer: Self-pay | Admitting: Pulmonary Disease

## 2018-04-23 DIAGNOSIS — R911 Solitary pulmonary nodule: Secondary | ICD-10-CM

## 2018-04-23 DIAGNOSIS — C679 Malignant neoplasm of bladder, unspecified: Secondary | ICD-10-CM | POA: Diagnosis not present

## 2018-04-23 NOTE — Telephone Encounter (Signed)
PCCM:  I received a call from Dr. Bobby Rumpf, [facility]. He is concerned about [name] LUL spiculated lesion.   We will plan for bronchoscopic evaluation but first we will need the following.   1. An appointment in the office with an APP for pre-op  2. SuperD CT Chest image (I have ordered)  3. Assistance with scheduling.   I have discussed the case with Dr. Lamonte Sakai. Due to the COVID-19 changes in the hospital coverage I will be unavailable for the next several days in the ICU. Therefore, Dr. Lamonte Sakai will look at his schedule and see if he can work the patient in for potentially Tuesday morning of next week if we can get the appt and superD completed by then.   CC: [name], [facility] 04/23/2018 2:39 PM

## 2018-04-23 NOTE — Telephone Encounter (Signed)
Pt has super D CT scheduled for Monday, 4/13. Called and spoke with pt in regards to the message that was received and asked him if we could possibly get him scheduled for an appt this afternoon for pre-op. Pt stated he would have to check with his daughter [name] to see if she could bring him and asked me to call him back in about 15 min. Will await and call pt back.   Called pt back who stated he had been able to get ahold of his daughter [name] and asked me to call him to schedule appt for pt at office. [name] expressed understanding. appt has been scheduled for pt Monday, 4/13 with TP at 11am. I provided [name] our office address. Also stated toher that pt is scheduled to have CT performed at 12:30 which we will give address info to them when they come to the office.  Routing to Dr. Valeta Harms and Dr. Lamonte Sakai as an Juluis Rainier that pt has been scheduled for Super D CT and OV.

## 2018-04-24 NOTE — Telephone Encounter (Signed)
I have [name] scheduled for ENB at [facility] on 02/27/2018 at 07:30, pending his evaluation by TP. Thanks everyone for helping facilitate the procedure.

## 2018-04-27 ENCOUNTER — Encounter: Payer: Self-pay | Admitting: Internal Medicine

## 2018-04-27 ENCOUNTER — Encounter (HOSPITAL_COMMUNITY): Payer: Self-pay | Admitting: *Deleted

## 2018-04-27 ENCOUNTER — Telehealth: Payer: Self-pay

## 2018-04-27 ENCOUNTER — Ambulatory Visit (INDEPENDENT_AMBULATORY_CARE_PROVIDER_SITE_OTHER): Payer: Medicare Other | Admitting: Internal Medicine

## 2018-04-27 ENCOUNTER — Ambulatory Visit: Payer: Medicare Other | Admitting: Adult Health

## 2018-04-27 ENCOUNTER — Ambulatory Visit (INDEPENDENT_AMBULATORY_CARE_PROVIDER_SITE_OTHER)
Admission: RE | Admit: 2018-04-27 | Discharge: 2018-04-27 | Disposition: A | Discharge status: Home / Self Care | Payer: Medicare Other | Source: Ambulatory Visit | Attending: Pulmonary Disease | Admitting: Pulmonary Disease

## 2018-04-27 ENCOUNTER — Other Ambulatory Visit: Payer: Self-pay

## 2018-04-27 VITALS — BP 114/60 | HR 78 | Temp 98.2°F | Ht 72.0 in | Wt 191.0 lb

## 2018-04-27 DIAGNOSIS — R911 Solitary pulmonary nodule: Secondary | ICD-10-CM | POA: Insufficient documentation

## 2018-04-27 DIAGNOSIS — K7460 Unspecified cirrhosis of liver: Secondary | ICD-10-CM | POA: Diagnosis not present

## 2018-04-27 DIAGNOSIS — C61 Malignant neoplasm of prostate: Secondary | ICD-10-CM | POA: Insufficient documentation

## 2018-04-27 NOTE — Progress Notes (Signed)
Spoke with pt for pre-op call. Pt is hard of hearing. Pt denies cardiac history and Diabetes.

## 2018-04-27 NOTE — H&P (View-Only) (Signed)
[name], male    DOB: November 29, 1933,  MRN: [id number]   Brief patient profile:  34 yowm quit smoking 1982 s obvious sequelae referred to pulmonary clinic 04/27/2018 by Dr   Bobby Rumpf in [place] with SPN LUL/med adenopathy at [facility] 04/22/2018  noted on w/u for Bladder Ca by urology/oncology  in [place] so referred to pulmonary clinic 04/27/2018 by Dr   Bobby Rumpf for fob      History of Present Illness  04/27/2018  Pulmonary/ 1st office eval/Ein Rijo  Chief Complaint  Patient presents with  . Pulmonary Consult    Pt scheduled with Dr Lamonte Sakai for [facility] 04/28/2018.   Dyspnea:  Not limited by breathing from desired activities  But due to balance /neuropathy/ quite sedentary/ uses [facility] parking  Cough: minimal/mucoid assoc with pnds  Sleep: sleeps flat/ one pillows  SABA use: has one, not using    No obvious day to day or daytime variability or assoc  purulent sputum or mucus plugs or hemoptysis or cp or chest tightness, subjective wheeze or overt sinus or hb symptoms.   Sleeping as above without nocturnal  or early am exacerbation  of respiratory  c/o's or need for noct saba. Also denies any obvious fluctuation of symptoms with weather or environmental changes or other aggravating or alleviating factors except as outlined above   No unusual exposure hx or h/o childhood pna/ asthma or knowledge of premature birth.  Current Allergies, Complete Past Medical History, Past Surgical History, Family History, and Social History were reviewed in [employer] record.  ROS  The following are not active complaints unless bolded Hoarseness, sore throat, dysphagia, dental problems, itching, sneezing,  nasal congestion or discharge of excess mucus or purulent secretions, ear ache,   fever, chills, sweats, unintended wt loss or wt gain, classically pleuritic or exertional cp,  orthopnea pnd or arm/hand swelling  or leg swelling minimal dep, presyncope, palpitations, abdominal pain,  anorexia, nausea, vomiting, diarrhea  or change in bowel habits or change in bladder habits, change in stools or change in urine, dysuria, hematuria,  rash, arthralgias, visual complaints, headache, numbness, weakness or ataxia or problems with walking or coordination,  change in mood or  memory.             Past Medical History:  Diagnosis Date  . Anxiety    takes Xanax nightly  . Arthritis   . Cancer [facility])    prostate  . Chronic neck pain   . Depression    wife and son died last yr. Not on any meds  . History of colon polyps    "pre cancerous"  . History of vertigo   . HOH (hard of hearing)   . Hyperlipidemia    takes Pravastatin and Fenofibrate daily  . Hypertension    takes Losartan,Labetalol,and Amlodipine daily  . Restless leg   . Urinary frequency   . Varicose veins   . Weakness    numbness in both hands    Outpatient Medications Prior to Visit  Medication Sig Dispense Refill  . albuterol (PROVENTIL HFA;VENTOLIN HFA) 108 (90 Base) MCG/ACT inhaler Inhale 1 puff into the lungs at bedtime.     . ALPRAZolam (XANAX) 0.25 MG tablet Take 0.25 mg by mouth at bedtime.     Marland Kitchen amLODipine (NORVASC) 5 MG tablet Take 5 mg by mouth daily.     . Ascorbic Acid (VITAMIN C) 1000 MG tablet Take 1,000 mg by mouth daily.     . bethanechol (  URECHOLINE) 25 MG tablet Take 25 mg by mouth 2 (two) times daily.    . fenofibrate micronized (LOFIBRA) 200 MG capsule Take 200 mg by mouth daily before breakfast.    . labetalol (NORMODYNE) 200 MG tablet Take 200 mg by mouth 2 (two) times daily.    . losartan (COZAAR) 50 MG tablet Take 50 mg by mouth daily.     . Multiple Vitamin (MULTIVITAMIN WITH MINERALS) TABS tablet Take 1 tablet by mouth daily. Centrum Silver for Men    . pravastatin (PRAVACHOL) 40 MG tablet Take 40 mg by mouth at bedtime.     . Probiotic CAPS Take 1 capsule by mouth daily.    . rOPINIRole (REQUIP) 0.25 MG tablet Take 0.75 mg by mouth at bedtime.     . traZODone (DESYREL) 50 MG  tablet Take 50 mg by mouth at bedtime as needed for sleep.        Objective:     BP 114/60 (BP Location: Left Arm, Cuff Size: Normal)   Pulse 78   Temp 98.2 F (36.8 C) (Oral)   Ht 6' (1.829 m)   Wt 191 lb (86.6 kg)   SpO2 98%   BMI 25.90 kg/m   SpO2: 98 %  RA  amb wm rambling historian, more focused on pinched nerves in neck to the exclusion of all other symptoms/ problems / very hard of hearing    HEENT: nl dentition / oropharynx. Nl external ear canals without cough reflex -  Mild  bilateral non-specific turbinate edema     NECK :  without JVD/Nodes/TM/ nl carotid upstrokes bilaterally   LUNGS: no acc muscle use,  Mild barrel  contour chest wall with bilateral  slt distant bs s audible wheeze and  without cough on insp or exp maneuver and mild  Hyperresonant  to  percussion bilaterally     CV:  RRR  no s3 or murmur or increase in P2, and no edema   ABD:  soft and nontender with pos late insp Hoover's  in the supine position. No bruits or organomegaly appreciated, bowel sounds nl  MS:   Nl gait/  ext warm without deformities, calf tenderness, cyanosis or clubbing No obvious joint restrictions   SKIN: warm and dry without lesions    NEURO:  alert, approp, nl sensorium with  no motor or cerebellar deficits apparent.         I personally reviewed images and agree with radiology impression as follows:  CT chest [place] 04/22/18  Subcarinal node x 1.3 x 2.1 cm MPN's largest is LUL x 1.7x 1.6  Cm spiculated  Cirrhosis, mild with L lobe enlargement   PET  not done     Labs ordered 04/27/2018   Cbc, bmet, INR req pre-op by Dr Byrum/ done     Assessment   Solitary pulmonary nodule on lung CT Quit smoking 1982  - CT chest 04/22/2018 [place] ? Met dz with Primary LUL?   Smoking hx is very remote but still the ct findings are most c/w a primary bronchogenic ca and the other nodules are non-specific but unlikely to be met bladder ca statistically so reasonable to  obtain tissue dx if possible to guide chemo as per oncology request and he is a reasonable candidate for anesthesia for Navigational bx /? EBUS if needed.   Discussed in detail all the  indications, usual  risks and alternatives  relative to the benefits with patient who agrees to proceed with bronchoscopy with biopsy.        Hepatic cirrhosis, unspecified hepatic cirrhosis type ([place]) Quit drinking 1982/ see CT 04/22/2018 -  INR requested in view of Ct findings / no clinical correlation         Total time devoted to counseling  > 50 % of initial 60 min office visit:  review case with pt/ discussion of options/alternatives/ personally creating written customized instructions  in presence of pt  then going over those specific  Instructions directly with the pt including how to use all of the meds but in particular covering each new medication in detail and the difference between the maintenance= "automatic" meds and the prns using an action plan format for the latter (If this problem/symptom => do that organization reading Left to right).  Please see AVS from this visit for a full list of these instructions which I personally wrote for this pt and  are unique to this visit.       Christinia Gully, MD 04/27/2018

## 2018-04-27 NOTE — Progress Notes (Signed)
[name], male    DOB: November 29, 1933,  MRN: [id number]   Brief patient profile:  34 yowm quit smoking 1982 s obvious sequelae referred to pulmonary clinic 04/27/2018 by Dr   Bobby Rumpf in [place] with SPN LUL/med adenopathy at [facility] 04/22/2018  noted on w/u for Bladder Ca by urology/oncology  in [place] so referred to pulmonary clinic 04/27/2018 by Dr   Bobby Rumpf for fob      History of Present Illness  04/27/2018  Pulmonary/ 1st office eval/Ziquan Fidel  Chief Complaint  Patient presents with  . Pulmonary Consult    Pt scheduled with Dr Lamonte Sakai for [facility] 04/28/2018.   Dyspnea:  Not limited by breathing from desired activities  But due to balance /neuropathy/ quite sedentary/ uses [facility] parking  Cough: minimal/mucoid assoc with pnds  Sleep: sleeps flat/ one pillows  SABA use: has one, not using    No obvious day to day or daytime variability or assoc  purulent sputum or mucus plugs or hemoptysis or cp or chest tightness, subjective wheeze or overt sinus or hb symptoms.   Sleeping as above without nocturnal  or early am exacerbation  of respiratory  c/o's or need for noct saba. Also denies any obvious fluctuation of symptoms with weather or environmental changes or other aggravating or alleviating factors except as outlined above   No unusual exposure hx or h/o childhood pna/ asthma or knowledge of premature birth.  Current Allergies, Complete Past Medical History, Past Surgical History, Family History, and Social History were reviewed in [employer] record.  ROS  The following are not active complaints unless bolded Hoarseness, sore throat, dysphagia, dental problems, itching, sneezing,  nasal congestion or discharge of excess mucus or purulent secretions, ear ache,   fever, chills, sweats, unintended wt loss or wt gain, classically pleuritic or exertional cp,  orthopnea pnd or arm/hand swelling  or leg swelling minimal dep, presyncope, palpitations, abdominal pain,  anorexia, nausea, vomiting, diarrhea  or change in bowel habits or change in bladder habits, change in stools or change in urine, dysuria, hematuria,  rash, arthralgias, visual complaints, headache, numbness, weakness or ataxia or problems with walking or coordination,  change in mood or  memory.             Past Medical History:  Diagnosis Date  . Anxiety    takes Xanax nightly  . Arthritis   . Cancer [facility])    prostate  . Chronic neck pain   . Depression    wife and son died last yr. Not on any meds  . History of colon polyps    "pre cancerous"  . History of vertigo   . HOH (hard of hearing)   . Hyperlipidemia    takes Pravastatin and Fenofibrate daily  . Hypertension    takes Losartan,Labetalol,and Amlodipine daily  . Restless leg   . Urinary frequency   . Varicose veins   . Weakness    numbness in both hands    Outpatient Medications Prior to Visit  Medication Sig Dispense Refill  . albuterol (PROVENTIL HFA;VENTOLIN HFA) 108 (90 Base) MCG/ACT inhaler Inhale 1 puff into the lungs at bedtime.     . ALPRAZolam (XANAX) 0.25 MG tablet Take 0.25 mg by mouth at bedtime.     Marland Kitchen amLODipine (NORVASC) 5 MG tablet Take 5 mg by mouth daily.     . Ascorbic Acid (VITAMIN C) 1000 MG tablet Take 1,000 mg by mouth daily.     . bethanechol (  URECHOLINE) 25 MG tablet Take 25 mg by mouth 2 (two) times daily.    . fenofibrate micronized (LOFIBRA) 200 MG capsule Take 200 mg by mouth daily before breakfast.    . labetalol (NORMODYNE) 200 MG tablet Take 200 mg by mouth 2 (two) times daily.    Marland Kitchen losartan (COZAAR) 50 MG tablet Take 50 mg by mouth daily.     . Multiple Vitamin (MULTIVITAMIN WITH MINERALS) TABS tablet Take 1 tablet by mouth daily. Centrum Silver for Men    . pravastatin (PRAVACHOL) 40 MG tablet Take 40 mg by mouth at bedtime.     . Probiotic CAPS Take 1 capsule by mouth daily.    Marland Kitchen rOPINIRole (REQUIP) 0.25 MG tablet Take 0.75 mg by mouth at bedtime.     . traZODone (DESYREL) 50 MG  tablet Take 50 mg by mouth at bedtime as needed for sleep.        Objective:     BP 114/60 (BP Location: Left Arm, Cuff Size: Normal)   Pulse 78   Temp 98.2 F (36.8 C) (Oral)   Ht 6' (1.829 m)   Wt 191 lb (86.6 kg)   SpO2 98%   BMI 25.90 kg/m   SpO2: 98 %  RA  amb wm rambling historian, more focused on pinched nerves in neck to the exclusion of all other symptoms/ problems / very hard of hearing    HEENT: nl dentition / oropharynx. Nl external ear canals without cough reflex -  Mild  bilateral non-specific turbinate edema     NECK :  without JVD/Nodes/TM/ nl carotid upstrokes bilaterally   LUNGS: no acc muscle use,  Mild barrel  contour chest wall with bilateral  slt distant bs s audible wheeze and  without cough on insp or exp maneuver and mild  Hyperresonant  to  percussion bilaterally     CV:  RRR  no s3 or murmur or increase in P2, and no edema   ABD:  soft and nontender with pos late insp Hoover's  in the supine position. No bruits or organomegaly appreciated, bowel sounds nl  MS:   Nl gait/  ext warm without deformities, calf tenderness, cyanosis or clubbing No obvious joint restrictions   SKIN: warm and dry without lesions    NEURO:  alert, approp, nl sensorium with  no motor or cerebellar deficits apparent.         I personally reviewed images and agree with radiology impression as follows:  CT chest [facility] 04/22/18  Subcarinal node x 1.3 x 2.1 cm MPN's largest is LUL x 1.7x 1.6  Cm spiculated  Cirrhosis, mild with L lobe enlargement   PET  not done     Labs ordered 04/27/2018   Cbc, bmet, INR req pre-op by Dr Lamonte Sakai done     Assessment   Solitary pulmonary nodule on lung CT Quit smoking 1982  - CT chest 04/22/2018 [place] ? Met dz with Primary LUL?   Smoking hx is very remote but still the ct findings are most c/w a primary bronchogenic ca and the other nodules are non-specific but unlikely to be met bladder ca statistically so reasonable to  obtain tissue dx if possible to guide chemo as per oncology request and he is a reasonable candidate for anesthesia for Navigational bx /? EBUS if needed.   Discussed in detail all the  indications, usual  risks and alternatives  relative to the benefits with patient who agrees to proceed with bronchoscopy with biopsy.  Hepatic cirrhosis, unspecified hepatic cirrhosis type ([place]) Quit drinking 1982/ see CT 04/22/2018 -  INR requested in view of Ct findings / no clinical correlation         Total time devoted to counseling  > 50 % of initial 60 min office visit:  review case with pt/ discussion of options/alternatives/ personally creating written customized instructions  in presence of pt  then going over those specific  Instructions directly with the pt including how to use all of the meds but in particular covering each new medication in detail and the difference between the maintenance= "automatic" meds and the prns using an action plan format for the latter (If this problem/symptom => do that organization reading Left to right).  Please see AVS from this visit for a full list of these instructions which I personally wrote for this pt and  are unique to this visit.       Christinia Gully, MD 04/27/2018

## 2018-04-27 NOTE — Assessment & Plan Note (Signed)
Quit smoking 1982  - CT chest 04/22/2018 [place] ? Met dz with Primary LUL?   Smoking hx is very remote but still the ct findings are most c/w a primary bronchogenic ca and the other nodules are non-specific but unlikely to be met bladder ca statistically so reasonable to obtain tissue dx if possible to guide chemo as per oncology request and he is a reasonable candidate for anesthesia for Navigational bx /? EBUS if needed.   Discussed in detail all the  indications, usual  risks and alternatives  relative to the benefits with patient who agrees to proceed with bronchoscopy with biopsy.   Total time devoted to counseling  > 50 % of initial 60 min office visit:  review case with pt/ discussion of options/alternatives/ personally creating written customized instructions  in presence of pt  then going over those specific  Instructions directly with the pt including how to use all of the meds but in particular covering each new medication in detail and the difference between the maintenance= "automatic" meds and the prns using an action plan format for the latter (If this problem/symptom => do that organization reading Left to right).  Please see AVS from this visit for a full list of these instructions which I personally wrote for this pt and  are unique to this visit.

## 2018-04-27 NOTE — Assessment & Plan Note (Addendum)
Quit drinking 1982/ see CT 04/22/2018 -  INR requested in view of Ct findings / no clinical correlation

## 2018-04-27 NOTE — Patient Instructions (Addendum)
Please remember to go to the lab department   for your tests - we will call you with the results when they are available.      Nothing to eat or drink after midnight tonight, ok to take you blood pressure medications first thing in am with a small amount of water.  Go to outpatient registration when you arrive at [facility]

## 2018-04-27 NOTE — Telephone Encounter (Signed)
Spoke with pt and daughter.  They are under the impression the CT scan is at 11am.  Scan is at 1230 and appt with doctor is at [name] is scheduled for tomorrow.  He said he would call after his CT scan.

## 2018-04-28 ENCOUNTER — Ambulatory Visit (HOSPITAL_COMMUNITY): Payer: Medicare Other

## 2018-04-28 ENCOUNTER — Ambulatory Visit (HOSPITAL_COMMUNITY)
Admission: RE | Admit: 2018-04-28 | Discharge: 2018-04-28 | Disposition: A | Discharge status: Home / Self Care | Payer: Medicare Other | Attending: Emergency Medicine | Admitting: Emergency Medicine

## 2018-04-28 ENCOUNTER — Ambulatory Visit (HOSPITAL_COMMUNITY): Payer: Medicare Other | Admitting: Anesthesiology

## 2018-04-28 ENCOUNTER — Other Ambulatory Visit: Payer: Self-pay

## 2018-04-28 ENCOUNTER — Encounter (HOSPITAL_COMMUNITY): Payer: Self-pay

## 2018-04-28 ENCOUNTER — Encounter (HOSPITAL_COMMUNITY)
Admission: RE | Disposition: A | Discharge status: Home / Self Care | Payer: Self-pay | Source: Home / Self Care | Attending: Emergency Medicine

## 2018-04-28 DIAGNOSIS — Z87891 Personal history of nicotine dependence: Secondary | ICD-10-CM | POA: Insufficient documentation

## 2018-04-28 DIAGNOSIS — I10 Essential (primary) hypertension: Secondary | ICD-10-CM | POA: Insufficient documentation

## 2018-04-28 DIAGNOSIS — Z8551 Personal history of malignant neoplasm of bladder: Secondary | ICD-10-CM | POA: Diagnosis not present

## 2018-04-28 DIAGNOSIS — C3412 Malignant neoplasm of upper lobe, left bronchus or lung: Secondary | ICD-10-CM | POA: Diagnosis not present

## 2018-04-28 DIAGNOSIS — Z8546 Personal history of malignant neoplasm of prostate: Secondary | ICD-10-CM | POA: Insufficient documentation

## 2018-04-28 DIAGNOSIS — G2581 Restless legs syndrome: Secondary | ICD-10-CM | POA: Diagnosis not present

## 2018-04-28 DIAGNOSIS — R918 Other nonspecific abnormal finding of lung field: Secondary | ICD-10-CM

## 2018-04-28 DIAGNOSIS — E785 Hyperlipidemia, unspecified: Secondary | ICD-10-CM | POA: Insufficient documentation

## 2018-04-28 DIAGNOSIS — F329 Major depressive disorder, single episode, unspecified: Secondary | ICD-10-CM | POA: Insufficient documentation

## 2018-04-28 DIAGNOSIS — F419 Anxiety disorder, unspecified: Secondary | ICD-10-CM | POA: Insufficient documentation

## 2018-04-28 DIAGNOSIS — R911 Solitary pulmonary nodule: Secondary | ICD-10-CM

## 2018-04-28 DIAGNOSIS — G629 Polyneuropathy, unspecified: Secondary | ICD-10-CM | POA: Insufficient documentation

## 2018-04-28 DIAGNOSIS — K7460 Unspecified cirrhosis of liver: Secondary | ICD-10-CM | POA: Insufficient documentation

## 2018-04-28 DIAGNOSIS — M1990 Unspecified osteoarthritis, unspecified site: Secondary | ICD-10-CM | POA: Insufficient documentation

## 2018-04-28 DIAGNOSIS — Z79899 Other long term (current) drug therapy: Secondary | ICD-10-CM | POA: Diagnosis not present

## 2018-04-28 DIAGNOSIS — Z9889 Other specified postprocedural states: Secondary | ICD-10-CM

## 2018-04-28 HISTORY — DX: Personal history of urinary calculi: Z87.442

## 2018-04-28 HISTORY — DX: Personal history of other diseases of the digestive system: Z87.19

## 2018-04-28 HISTORY — PX: VIDEO BRONCHOSCOPY WITH ENDOBRONCHIAL NAVIGATION: SHX6175

## 2018-04-28 LAB — CBC WITH DIFFERENTIAL/PLATELET
Absolute Monocytes: 670 cells/uL (ref 200–950)
Basophils Absolute: 39 cells/uL (ref 0–200)
Basophils Relative: 0.6 %
Eosinophils Absolute: 260 cells/uL (ref 15–500)
Eosinophils Relative: 4 %
HCT: 40.3 % (ref 38.5–50.0)
Hemoglobin: 13.8 g/dL (ref 13.2–17.1)
Lymphs Abs: 1287 cells/uL (ref 850–3900)
MCH: 32.5 pg (ref 27.0–33.0)
MCHC: 34.2 g/dL (ref 32.0–36.0)
MCV: 94.8 fL (ref 80.0–100.0)
MPV: 11.2 fL (ref 7.5–12.5)
Monocytes Relative: 10.3 %
Neutro Abs: 4245 cells/uL (ref 1500–7800)
Neutrophils Relative %: 65.3 %
Platelets: 157 10*3/uL (ref 140–400)
RBC: 4.25 10*6/uL (ref 4.20–5.80)
RDW: 13.4 % (ref 11.0–15.0)
Total Lymphocyte: 19.8 %
WBC: 6.5 10*3/uL (ref 3.8–10.8)

## 2018-04-28 LAB — BASIC METABOLIC PANEL
BUN: 16 mg/dL (ref 7–25)
CO2: 24 mmol/L (ref 20–32)
Calcium: 9.9 mg/dL (ref 8.6–10.3)
Chloride: 105 mmol/L (ref 98–110)
Creat: 1.03 mg/dL (ref 0.70–1.11)
Glucose, Bld: 166 mg/dL — ABNORMAL HIGH (ref 65–99)
Potassium: 3.9 mmol/L (ref 3.5–5.3)
Sodium: 139 mmol/L (ref 135–146)

## 2018-04-28 LAB — PROTIME-INR
INR: 1
Prothrombin Time: 10.5 s (ref 9.0–11.5)

## 2018-04-28 SURGERY — VIDEO BRONCHOSCOPY WITH ENDOBRONCHIAL NAVIGATION
Anesthesia: General

## 2018-04-28 MED ORDER — SUCCINYLCHOLINE CHLORIDE 200 MG/10ML IV SOSY
PREFILLED_SYRINGE | INTRAVENOUS | Status: AC
  Filled 2018-04-28: qty 10

## 2018-04-28 MED ORDER — LACTATED RINGERS IV SOLN
INTRAVENOUS | Status: DC | PRN
  Administered 2018-04-28: 07:00:00 via INTRAVENOUS

## 2018-04-28 MED ORDER — OXYCODONE HCL 5 MG/5ML PO SOLN: 5.0000 mg | Freq: Once | ORAL | Status: DC | PRN

## 2018-04-28 MED ORDER — ROCURONIUM BROMIDE 10 MG/ML (PF) SYRINGE
PREFILLED_SYRINGE | INTRAVENOUS | Status: DC | PRN
  Administered 2018-04-28: 40 mg via INTRAVENOUS

## 2018-04-28 MED ORDER — LIDOCAINE 2% (20 MG/ML) 5 ML SYRINGE
INTRAMUSCULAR | Status: AC
  Filled 2018-04-28: qty 5

## 2018-04-28 MED ORDER — EPINEPHRINE PF 1 MG/ML IJ SOLN
INTRAMUSCULAR | Status: DC | PRN
  Administered 2018-04-28: 1 mg via ENDOTRACHEOPULMONARY

## 2018-04-28 MED ORDER — EPINEPHRINE PF 1 MG/ML IJ SOLN
INTRAMUSCULAR | Status: AC
  Filled 2018-04-28: qty 1

## 2018-04-28 MED ORDER — SODIUM CHLORIDE 0.9 % IV SOLN
INTRAVENOUS | Status: DC | PRN
  Administered 2018-04-28: 08:00:00 50 ug/min via INTRAVENOUS

## 2018-04-28 MED ORDER — PROPOFOL 10 MG/ML IV BOLUS
INTRAVENOUS | Status: DC | PRN
  Administered 2018-04-28: 140 mg via INTRAVENOUS

## 2018-04-28 MED ORDER — 0.9 % SODIUM CHLORIDE (POUR BTL) OPTIME
TOPICAL | Status: DC | PRN
  Administered 2018-04-28: 1000 mL

## 2018-04-28 MED ORDER — ROCURONIUM BROMIDE 50 MG/5ML IV SOSY
PREFILLED_SYRINGE | INTRAVENOUS | Status: AC
  Filled 2018-04-28: qty 5

## 2018-04-28 MED ORDER — LIDOCAINE 2% (20 MG/ML) 5 ML SYRINGE
INTRAMUSCULAR | Status: DC | PRN
  Administered 2018-04-28: 50 mg via INTRAVENOUS

## 2018-04-28 MED ORDER — PROPOFOL 10 MG/ML IV BOLUS
INTRAVENOUS | Status: AC
  Filled 2018-04-28: qty 20

## 2018-04-28 MED ORDER — SUCCINYLCHOLINE CHLORIDE 20 MG/ML IJ SOLN
INTRAMUSCULAR | Status: DC | PRN
  Administered 2018-04-28: 100 mg via INTRAVENOUS

## 2018-04-28 MED ORDER — GLYCOPYRROLATE 0.2 MG/ML IJ SOLN
INTRAMUSCULAR | Status: DC | PRN
  Administered 2018-04-28 (×2): 0.1 mg via INTRAVENOUS

## 2018-04-28 MED ORDER — ONDANSETRON HCL 4 MG/2ML IJ SOLN
INTRAMUSCULAR | Status: DC | PRN
  Administered 2018-04-28: 4 mg via INTRAVENOUS

## 2018-04-28 MED ORDER — ONDANSETRON HCL 4 MG/2ML IJ SOLN: 4.0000 mg | Freq: Once | INTRAMUSCULAR | Status: DC | PRN

## 2018-04-28 MED ORDER — SUGAMMADEX SODIUM 200 MG/2ML IV SOLN
INTRAVENOUS | Status: DC | PRN
  Administered 2018-04-28: 180 mg via INTRAVENOUS

## 2018-04-28 MED ORDER — OXYCODONE HCL 5 MG PO TABS: 5.0000 mg | ORAL_TABLET | Freq: Once | ORAL | Status: DC | PRN

## 2018-04-28 MED ORDER — FENTANYL CITRATE (PF) 100 MCG/2ML IJ SOLN: 25.0000 ug | INTRAMUSCULAR | Status: DC | PRN

## 2018-04-28 MED ORDER — FENTANYL CITRATE (PF) 100 MCG/2ML IJ SOLN
INTRAMUSCULAR | Status: DC | PRN
  Administered 2018-04-28: 100 ug via INTRAVENOUS
  Administered 2018-04-28: 25 ug via INTRAVENOUS

## 2018-04-28 MED ORDER — DEXAMETHASONE SODIUM PHOSPHATE 10 MG/ML IJ SOLN
INTRAMUSCULAR | Status: AC
  Filled 2018-04-28: qty 1

## 2018-04-28 MED ORDER — DEXAMETHASONE SODIUM PHOSPHATE 10 MG/ML IJ SOLN
INTRAMUSCULAR | Status: DC | PRN
  Administered 2018-04-28: 10 mg via INTRAVENOUS

## 2018-04-28 MED ORDER — GLYCOPYRROLATE PF 0.2 MG/ML IJ SOSY
PREFILLED_SYRINGE | INTRAMUSCULAR | Status: AC
  Filled 2018-04-28: qty 1

## 2018-04-28 MED ORDER — FENTANYL CITRATE (PF) 250 MCG/5ML IJ SOLN
INTRAMUSCULAR | Status: AC
  Filled 2018-04-28: qty 5

## 2018-04-28 MED ORDER — ONDANSETRON HCL 4 MG/2ML IJ SOLN
INTRAMUSCULAR | Status: AC
  Filled 2018-04-28: qty 2

## 2018-04-28 SURGICAL SUPPLY — 47 items
ADAPTER BRONCHOSCOPE OLYMPUS (ADAPTER) ×3 IMPLANT
ADAPTER VALVE BIOPSY EBUS (MISCELLANEOUS) IMPLANT
ADPR BSCP OLMPS EDG (ADAPTER) ×1
ADPTR VALVE BIOPSY EBUS (MISCELLANEOUS)
BRUSH BIOPSY BRONCH 10 SDTNB (MISCELLANEOUS) ×1 IMPLANT
BRUSH BIOPSY BRONCH 10MM SDTNB (MISCELLANEOUS) ×1
BRUSH CYTOL CELLEBRITY 1.5X140 (MISCELLANEOUS) ×3 IMPLANT
BRUSH SUPERTRAX BIOPSY (INSTRUMENTS) IMPLANT
BRUSH SUPERTRAX NDL-TIP CYTO (INSTRUMENTS) ×3 IMPLANT
CANISTER SUCT 3000ML PPV (MISCELLANEOUS) ×3 IMPLANT
CHANNEL WORK EXTEND EDGE 180 (KITS) IMPLANT
CHANNEL WORK EXTEND EDGE 90 (KITS) IMPLANT
CONT SPEC 4OZ CLIKSEAL STRL BL (MISCELLANEOUS) ×5 IMPLANT
COVER BACK TABLE 60X90IN (DRAPES) ×3 IMPLANT
COVER WAND RF STERILE (DRAPES) ×1 IMPLANT
FILTER STRAW FLUID ASPIR (MISCELLANEOUS) IMPLANT
FORCEPS BIOP SUPERTRX PREMAR (INSTRUMENTS) ×3 IMPLANT
GAUZE SPONGE 4X4 12PLY STRL (GAUZE/BANDAGES/DRESSINGS) ×3 IMPLANT
GLOVE BIO SURGEON STRL SZ7.5 (GLOVE) ×6 IMPLANT
GOWN STRL REUS W/ TWL LRG LVL3 (GOWN DISPOSABLE) ×2 IMPLANT
GOWN STRL REUS W/TWL LRG LVL3 (GOWN DISPOSABLE) ×6
KIT CLEAN ENDO COMPLIANCE (KITS) ×3 IMPLANT
KIT LOCATABLE GUIDE (CANNULA) IMPLANT
KIT MARKER FIDUCIAL DELIVERY (KITS) ×2 IMPLANT
KIT PROCEDURE EDGE 180 (KITS) ×2 IMPLANT
KIT PROCEDURE EDGE 90 (KITS) IMPLANT
KIT TURNOVER KIT B (KITS) ×3 IMPLANT
MARKER FIDUCIAL SL NIT COIL (Implant Marker) ×6 IMPLANT
MARKER SKIN DUAL TIP RULER LAB (MISCELLANEOUS) ×3 IMPLANT
NDL SUPERTRX PREMARK BIOPSY (NEEDLE) ×1 IMPLANT
NEEDLE SUPERTRX PREMARK BIOPSY (NEEDLE) ×3 IMPLANT
NS IRRIG 1000ML POUR BTL (IV SOLUTION) ×3 IMPLANT
OIL SILICONE PENTAX (PARTS (SERVICE/REPAIRS)) ×3 IMPLANT
PAD ARMBOARD 7.5X6 YLW CONV (MISCELLANEOUS) ×6 IMPLANT
PATCHES PATIENT (LABEL) ×9 IMPLANT
SYR 20CC LL (SYRINGE) ×3 IMPLANT
SYR 20ML ECCENTRIC (SYRINGE) ×3 IMPLANT
SYR 50ML SLIP (SYRINGE) ×3 IMPLANT
TOWEL OR 17X24 6PK STRL BLUE (TOWEL DISPOSABLE) ×3 IMPLANT
TRAP SPECIMEN MUCOUS 40CC (MISCELLANEOUS) ×2 IMPLANT
TUBE CONNECTING 20'X1/4 (TUBING) ×1
TUBE CONNECTING 20X1/4 (TUBING) ×2 IMPLANT
UNDERPAD 30X30 (UNDERPADS AND DIAPERS) ×3 IMPLANT
VALVE BIOPSY  SINGLE USE (MISCELLANEOUS) ×2
VALVE BIOPSY SINGLE USE (MISCELLANEOUS) ×1 IMPLANT
VALVE SUCTION BRONCHIO DISP (MISCELLANEOUS) ×3 IMPLANT
WATER STERILE IRR 1000ML POUR (IV SOLUTION) ×3 IMPLANT

## 2018-04-28 NOTE — Op Note (Signed)
Video Bronchoscopy with Electromagnetic Navigation Procedure Note  Date of Operation: 04/28/2018  Pre-op Diagnosis: Left upper lobe nodule  Post-op Diagnosis: Same  Surgeon: Baltazar Apo  Assistants: None  Anesthesia: General endotracheal anesthesia  Operation: Flexible video fiberoptic bronchoscopy with electromagnetic navigation and biopsies.  Estimated Blood Loss: 15 to 20 cc  Complications: None apparent  Indications and History: [name] is a 85 y.o. male with history of remote tobacco use.  He has been treated and followed by oncology and urology for prostate cancer and more recently bladder cancer.  He was found to have a spiculated solitary left upper lobe nodule on CT scan of the chest.  Recommendation was made to achieve a tissue diagnosis via navigational bronchoscopy.  The risks, benefits, complications, treatment options and expected outcomes were discussed with the patient.  The possibilities of pneumothorax, pneumonia, reaction to medication, pulmonary aspiration, perforation of a viscus, bleeding, failure to diagnose a condition and creating a complication requiring transfusion or operation were discussed with the patient who freely signed the consent.    Description of Procedure: The patient was seen in the Preoperative Area, was examined and was deemed appropriate to proceed.  The patient was taken to OR 17, identified as [name] and the procedure verified as Flexible Video Fiberoptic Bronchoscopy.  A Time Out was held and the above information confirmed.   Prior to the date of the procedure a high-resolution CT scan of the chest was performed. Utilizing [place] a virtual tracheobronchial tree was generated to allow the creation of distinct navigation pathways to the patient's left upper lobe parenchymal abnormality. After being taken to the operating room general anesthesia was initiated and the patient  was orally intubated. The video  fiberoptic bronchoscope was introduced via the endotracheal tube and a general inspection was performed which showed normal airways throughout.  There was some thick white secretions that were easily suctioned.  There were no endobronchial lesions. The extendable working channel and locator guide were introduced into the bronchoscope. The distinct navigation pathways prepared prior to this procedure were then utilized to navigate to within 0.5 to 0.8 cm of patient's lesion identified on CT scan.  Local registration was performed via fluoroscopy.  The extendable working channel was secured into place and the locator guide was withdrawn. Under fluoroscopic guidance transbronchial needle brushings and transbronchial forceps biopsies were performed to be sent for cytology and pathology.  Three gold fiducial markers were placed triangulating the nodule to facilitate radiation therapy should this become necessary going forward.  At the end of the procedure a general airway inspection was performed and there was no evidence of active bleeding. The bronchoscope was removed.  The patient tolerated the procedure well. There was no significant blood loss and there were no obvious complications. A post-procedural chest x-ray is pending.  Samples: 1. Transbronchial needle brushings from left upper lobe nodule 2. Transbronchial forceps biopsies from left upper lobe nodule   Plans:  The patient will be discharged from the PACU to home when recovered from anesthesia and after chest x-ray is reviewed. We will review the cytology, pathology results with the patient when they become available. Outpatient followup will be with Dr. Lamonte Sakai and Dr. Bobby Rumpf with Oncology in [place].    Baltazar Apo, MD, PhD 04/28/2018, 8:51 AM [facility] [phone] or if no answer [phone]

## 2018-04-28 NOTE — Progress Notes (Signed)
Spoke with pt and notified of results per Dr. Wert. Pt verbalized understanding and denied any questions. 

## 2018-04-28 NOTE — Progress Notes (Signed)
MD and I tried multiple attempts to reach patients ride [name], her phone has gone straight to voicemail every time.

## 2018-04-28 NOTE — Transfer of Care (Signed)
Immediate Anesthesia Transfer of Care Note  Patient: [name]  Procedure(s) Performed: VIDEO BRONCHOSCOPY WITH ENDOBRONCHIAL NAVIGATION (N/A )  Patient Location: PACU  Anesthesia Type:General  Level of Consciousness: awake, alert , oriented and patient cooperative  Airway & Oxygen Therapy: Patient Spontanous Breathing and Patient connected to face mask oxygen  Post-op Assessment: Report given to RN and Post -op Vital signs reviewed and stable  Post vital signs: Reviewed and stable  Last Vitals:  Vitals Value Taken Time  BP 121/74 04/28/2018  8:58 AM  Temp    Pulse 80 04/28/2018  9:01 AM  Resp 20 04/28/2018  9:01 AM  SpO2 99 % 04/28/2018  9:01 AM  Vitals shown include unvalidated device data.  Last Pain:  Vitals:   04/28/18 0612  TempSrc:   PainSc: 0-No pain         Complications: No apparent anesthesia complications

## 2018-04-28 NOTE — Anesthesia Postprocedure Evaluation (Signed)
Anesthesia Post Note  Patient: [name]  Procedure(s) Performed: VIDEO BRONCHOSCOPY WITH ENDOBRONCHIAL NAVIGATION (N/A )     Patient location during evaluation: PACU Anesthesia Type: General Level of consciousness: awake and alert Pain management: pain level controlled Vital Signs Assessment: post-procedure vital signs reviewed and stable Respiratory status: spontaneous breathing, nonlabored ventilation, respiratory function stable and patient connected to nasal cannula oxygen Cardiovascular status: blood pressure returned to baseline and stable Postop Assessment: no apparent nausea or vomiting Anesthetic complications: no    Last Vitals:  Vitals:   04/28/18 0930 04/28/18 1000  BP: 128/77 116/65  Pulse: 78 74  Resp: 18 (!) 23  Temp: (!) 36.4 C   SpO2: 95% 93%    Last Pain:  Vitals:   04/28/18 0930  TempSrc:   PainSc: 0-No pain                 Attila Mccarthy COKER

## 2018-04-28 NOTE — Discharge Instructions (Signed)
Flexible Bronchoscopy, Care After This sheet gives you information about how to care for yourself after your test. Your doctor may also give you more specific instructions. If you have problems or questions, contact your doctor. Follow these instructions at home: Eating and drinking  Do not eat or drink anything (not even water) for 2 hours after your test, or until your numbing medicine (local anesthetic) wears off.  When your numbness is gone and your cough and gag reflexes have come back, you may: ? Eat only soft foods. ? Slowly drink liquids.  The day after the test, go back to your normal diet. Driving  Do not drive for 24 hours if you were given a medicine to help you relax (sedative).  Do not drive or use heavy machinery while taking prescription pain medicine. General instructions   Take over-the-counter and prescription medicines only as told by your doctor.  Return to your normal activities as told. Ask what activities are safe for you.  Do not use any products that have nicotine or tobacco in them. This includes cigarettes and e-cigarettes. If you need help quitting, ask your doctor.  Keep all follow-up visits as told by your doctor. This is important. It is very important if you had a tissue sample (biopsy) taken. Get help right away if:  You have shortness of breath that gets worse.  You get light-headed.  You feel like you are going to pass out (faint).  You have chest pain.  You cough up: ? More than a little blood. ? More blood than before. Summary  Do not eat or drink anything (not even water) for 2 hours after your test, or until your numbing medicine wears off.  Do not use cigarettes. Do not use e-cigarettes.  Get help right away if you have chest pain.   Please call our office for any questions or concerns.  [phone].  This information is not intended to replace advice given to you by your health care provider. Make sure you discuss any  questions you have with your health care provider. Document Released: 10/28/2008 Document Revised: 01/19/2016 Document Reviewed: 01/19/2016 Elsevier Interactive Patient Education  2019 [employer].

## 2018-04-28 NOTE — Anesthesia Procedure Notes (Signed)
Procedure Name: Intubation Date/Time: 04/28/2018 7:41 AM Performed by: Cleda Daub, CRNA Pre-anesthesia Checklist: Patient identified, Emergency Drugs available, Suction available and Patient being monitored Patient Re-evaluated:Patient Re-evaluated prior to induction Oxygen Delivery Method: Circle system utilized Preoxygenation: Pre-oxygenation with 100% oxygen Induction Type: IV induction and Rapid sequence Laryngoscope Size: Glidescope Tube type: Oral Tube size: 8.5 mm Number of attempts: 1 Airway Equipment and Method: Stylet and Video-laryngoscopy Placement Confirmation: ETT inserted through vocal cords under direct vision,  positive ETCO2 and breath sounds checked- equal and bilateral Secured at: 23 cm Tube secured with: Tape Dental Injury: Teeth and Oropharynx as per pre-operative assessment

## 2018-04-28 NOTE — Anesthesia Preprocedure Evaluation (Signed)
Anesthesia Evaluation  Patient identified by MRN, date of birth, ID band Patient awake    Reviewed: Allergy & Precautions, NPO status , Patient's Chart, lab work & pertinent test results  Airway Mallampati: II  TM Distance: >3 FB Neck ROM: Full    Dental  (+) Edentulous Upper, Edentulous Lower   Pulmonary former smoker,    breath sounds clear to auscultation       Cardiovascular hypertension,  Rhythm:Regular Rate:Normal     Neuro/Psych    GI/Hepatic   Endo/Other    Renal/GU      Musculoskeletal   Abdominal   Peds  Hematology   Anesthesia Other Findings   Reproductive/Obstetrics                             Anesthesia Physical Anesthesia Plan  ASA: III  Anesthesia Plan: General   Post-op Pain Management:    Induction: Intravenous  PONV Risk Score and Plan: Ondansetron and Dexamethasone  Airway Management Planned: Oral ETT and Video Laryngoscope Planned  Additional Equipment:   Intra-op Plan:   Post-operative Plan: Extubation in OR  Informed Consent: I have reviewed the patients History and Physical, chart, labs and discussed the procedure including the risks, benefits and alternatives for the proposed anesthesia with the patient or authorized representative who has indicated his/her understanding and acceptance.       Plan Discussed with: CRNA and Anesthesiologist  Anesthesia Plan Comments: (S/P ACDF and posterior cervical fusions  Plan GA with oral ETT)        Anesthesia Quick Evaluation

## 2018-04-28 NOTE — Interval H&P Note (Signed)
PCCM Interval Note  85 year old man, former smoker without any significant COPD, followed for bladder cancer by urology and oncology in [place].  He also has a history of prostate cancer.  He is referred for a solitary left upper lobe pulmonary nodule suspicious for possible primary lung malignancy.  Metastatic disease also considered although unlikely based on presentation.  He denies any significant shortness of breath, does have some difficulty with nasal congestion, some ataxia and difficulty ambulating.  Vitals:   04/28/18 0601  BP: (!) 147/74  Pulse: 75  Resp: 18  Temp: 98 F (36.7 C)  TempSrc: Oral  SpO2: 100%  Weight: 88.9 kg  Height: 6' (1.829 m)  Elderly man, no distress.  Significantly decreased hearing.  Interacting appropriately, answering questions appropriately.  Moves all extremities.  Lungs are distant but clear bilaterally without any crackles or wheezes.  Heart regular without a murmur.  Abdomen is soft, nondistended with positive bowel sounds.  No significant lower extremity edema.  BMP Latest Ref Rng & Units 04/27/2018 01/29/2016 01/28/2016  Glucose 65 - 99 mg/dL 166(H) 121(H) 139(H)  BUN 7 - 25 mg/dL 16 16 18   Creatinine 0.70 - 1.11 mg/dL 1.03 0.81 1.06  BUN/Creat Ratio 6 - 22 (calc) NOT APPLICABLE - -  Sodium 676 - 146 mmol/L 139 142 142  Potassium 3.5 - 5.3 mmol/L 3.9 3.8 2.9(L)  Chloride 98 - 110 mmol/L 105 108 105  CO2 20 - 32 mmol/L 24 26 28   Calcium 8.6 - 10.3 mg/dL 9.9 9.2 9.4   CBC Latest Ref Rng & Units 04/27/2018 01/29/2016 01/28/2016  WBC 3.8 - 10.8 Thousand/uL 6.5 8.0 9.2  Hemoglobin 13.2 - 17.1 g/dL 13.8 11.2(L) 12.3(L)  Hematocrit 38.5 - 50.0 % 40.3 32.2(L) 35.6(L)  Platelets 140 - 400 Thousand/uL 157 148(L) 141(L)   INR 1.0 (04/27/2018)  Plan: Navigational bronchoscopy with sampling of his left upper lobe nodule, possible fiducial placement.  Procedure explained to the patient including risks, benefits.  He understands.  All questions answered.  He  elects to proceed.  His daughter Olivia Mackie is here.  She is in her car due to the isolation rules in the hospital at this time and will be contacted when the procedure is completed.   Baltazar Apo, MD, PhD 04/28/2018, 7:23 AM [facility] [phone] or if no answer [phone]

## 2018-04-29 ENCOUNTER — Encounter (HOSPITAL_COMMUNITY): Payer: Self-pay | Admitting: Emergency Medicine

## 2018-04-30 ENCOUNTER — Telehealth: Payer: Self-pay | Admitting: Emergency Medicine

## 2018-04-30 NOTE — Telephone Encounter (Signed)
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 He is doing OK post-procedure, still having some old blood in his sputum. He will follow with Dr Bobby Rumpf in [place], has an [facility] set for 05/05/2018.   I'll try to contact Dr Bobby Rumpf with this information.

## 2018-05-05 ENCOUNTER — Telehealth: Payer: Self-pay | Admitting: Emergency Medicine

## 2018-05-05 DIAGNOSIS — C679 Malignant neoplasm of bladder, unspecified: Secondary | ICD-10-CM

## 2018-05-05 NOTE — Telephone Encounter (Signed)
Called and spoke with [name] at [facility] verifying which report she was needing on pt and per [name], the most recent procedure that was performed by Dr. Lamonte Sakai is what she was needing. Report has been faxed to provided fax number. Nothing further needed.

## 2019-03-15 DEATH — deceased

## 2019-11-06 IMAGING — CT CT SUPER D CHEST WITHOUT CONTRAST
2 of 4 series · 15 of 36 positions shown, 18 images · non-contrast
Comparison: Chest CT 04/22/2018.

CLINICAL DATA: 84-year-old male with history of left upper lobe
pulmonary nodule. Follow-up study.

EXAM:
CT CHEST WITHOUT CONTRAST
TECHNIQUE: Multidetector CT imaging of the chest was performed using thin slice
collimation for electromagnetic bronchoscopy planning purposes,
without intravenous contrast.

[Series 4: thins · axial · 0.74mm/px · z∈[-335,-51]mm · 12 of 397 slices shown, 15 images]
[im 21/397  mediastinal]
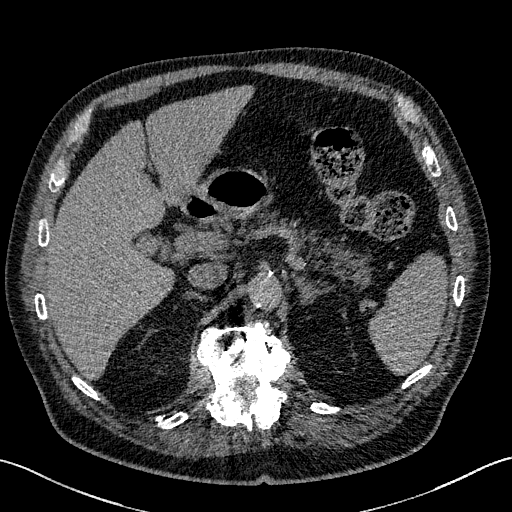
[im 21/397  lung]
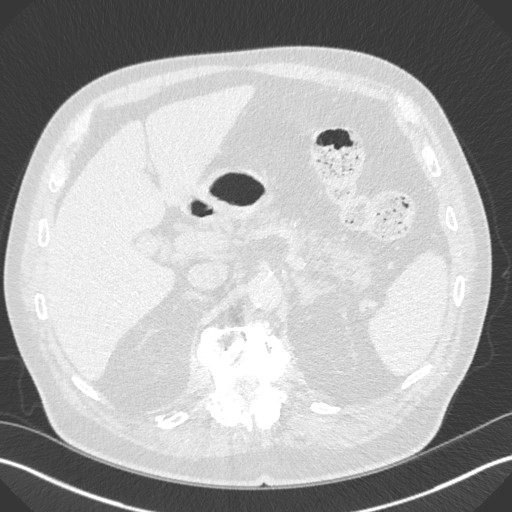
[im 63/397  lung]
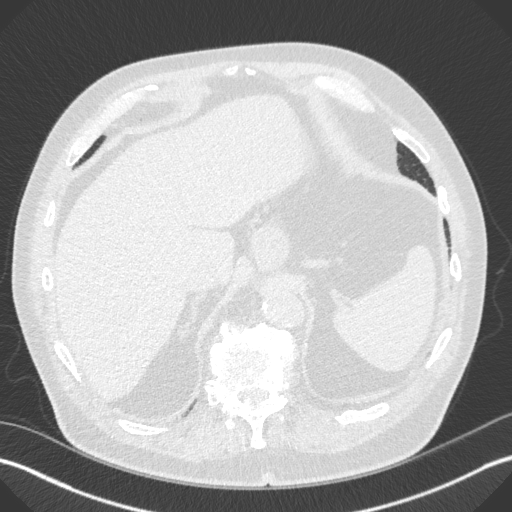
[im 84/397  lung]
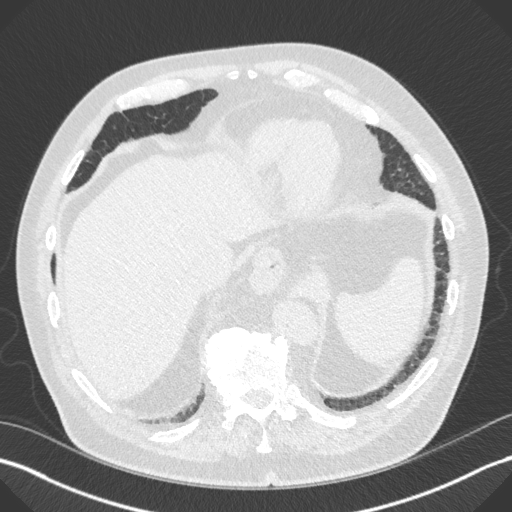
[im 126/397  lung]
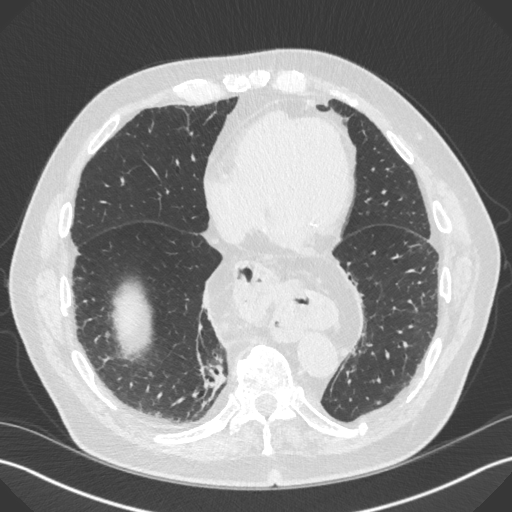
[im 146/397  mediastinal]
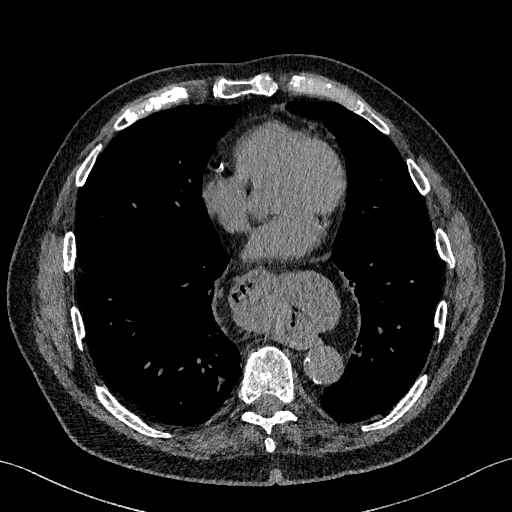
[im 146/397  lung]
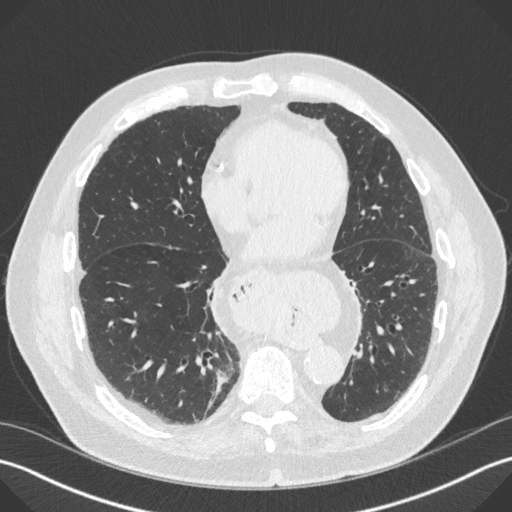
[im 188/397  lung]
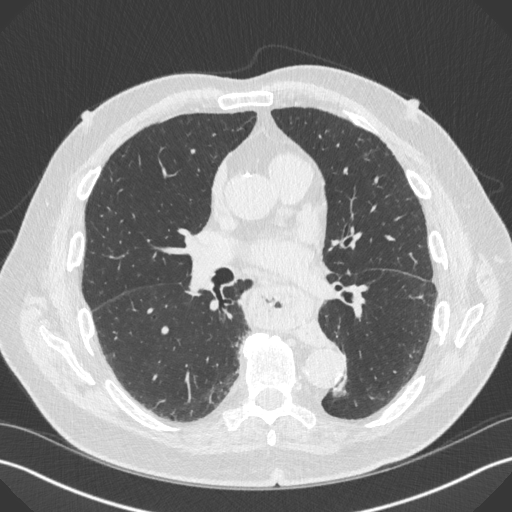
[im 209/397  lung]
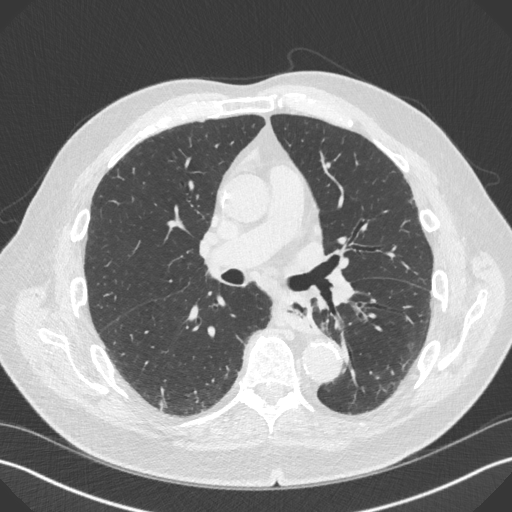
[im 251/397  lung]
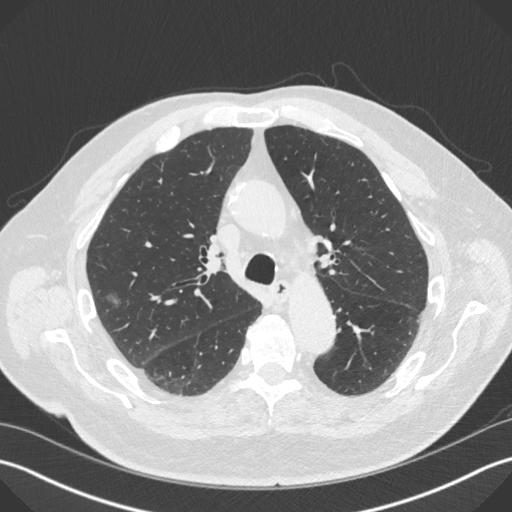
[im 271/397  mediastinal]
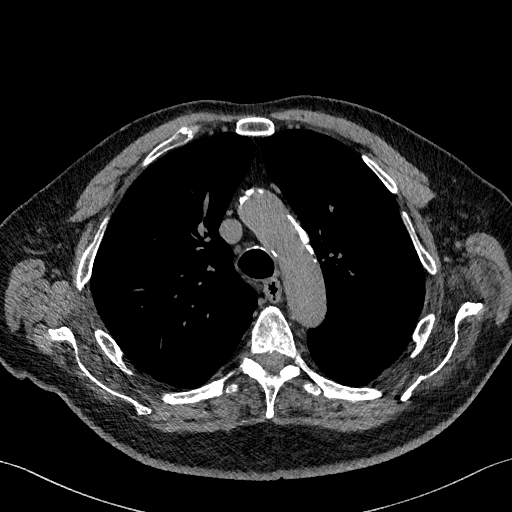
[im 271/397  lung]
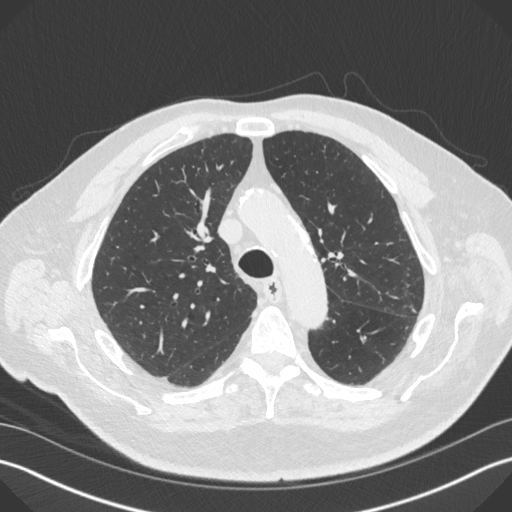
[im 313/397  lung]
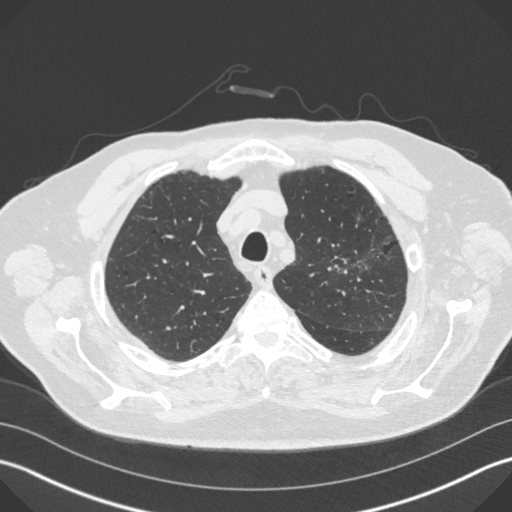
[im 334/397  lung]
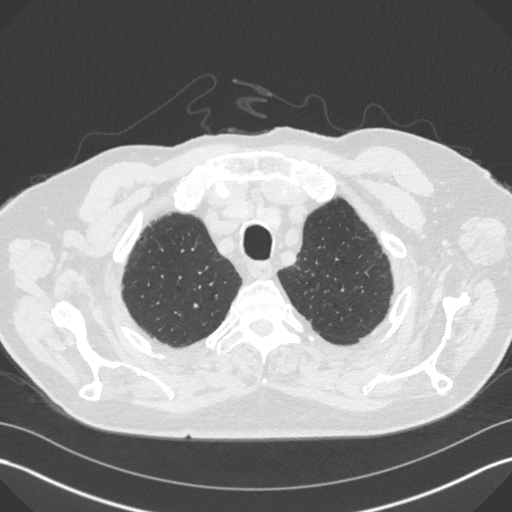
[im 376/397  lung]
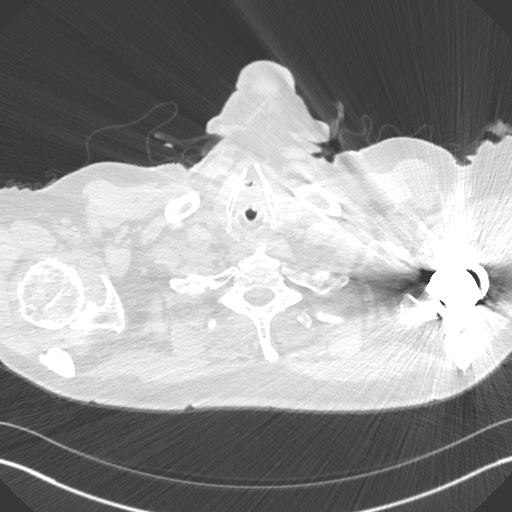

[Series 5: coronal · coronal · 0.64mm/px · 3 of 96 slices shown]
[im 20/96  lung]
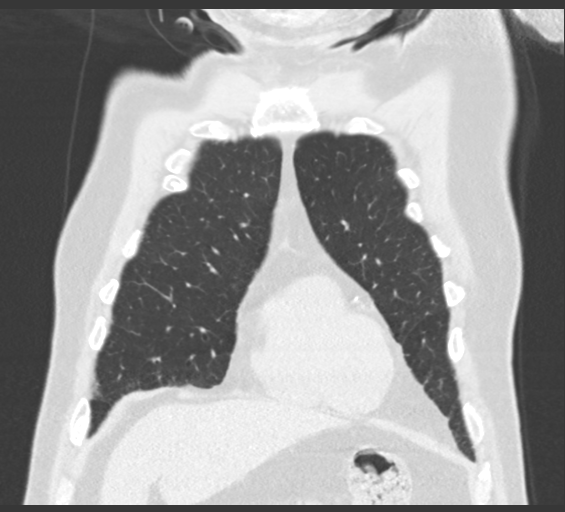
[im 39/96  lung]
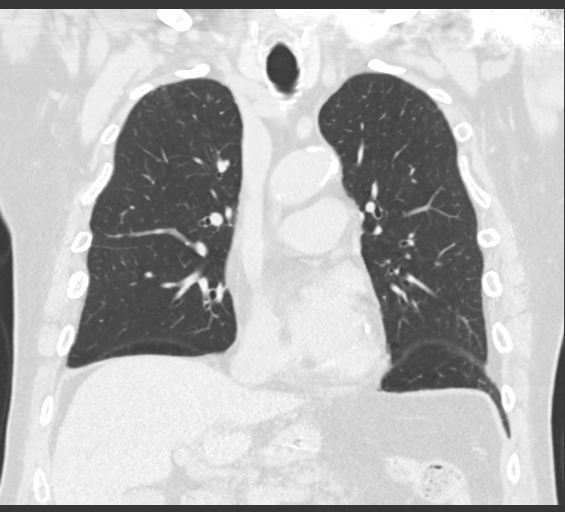
[im 58/96  lung]
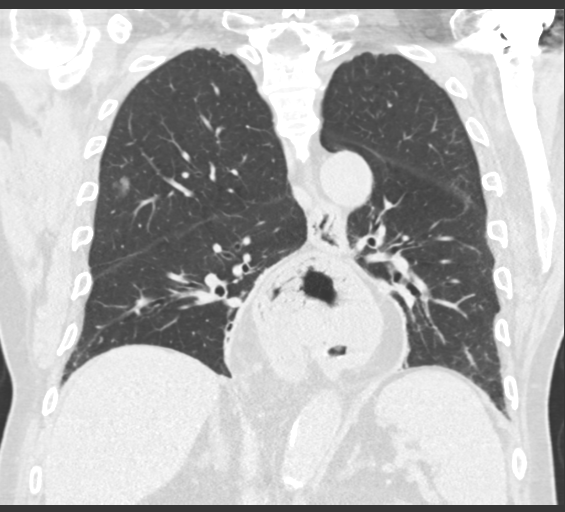

[15 of 36 positions shown; findings below may reference images not displayed]

FINDINGS: Cardiovascular: Heart size is normal. There is no significant
pericardial fluid, thickening or pericardial calcification. There is
aortic atherosclerosis, as well as atherosclerosis of the great
vessels of the mediastinum and the coronary arteries, including
calcified atherosclerotic plaque in the left main, left anterior
descending, left circumflex and right coronary arteries.
Calcifications of the aortic valve.

Mediastinum/Nodes: No pathologically enlarged mediastinal or hilar
lymph nodes. Please note that accurate exclusion of hilar adenopathy
is limited on noncontrast CT scans. Large hiatal hernia. No axillary
lymphadenopathy.

Lungs/Pleura: In the left upper lobe there is a dominant lesion
(axial image 44 of series 3) measuring 15 x 17 mm with internal air
bronchograms. Along the superior and lateral margin of this lesion
in an area of spiculations there is an additional smaller 9 x 8 mm
nodule (axial image 38 of series 3). Several other smaller
subcentimeter pulmonary nodules are noted throughout the lungs
bilaterally. In addition, in the periphery of the right upper lobe
(axial image 58 of series 3) there is a 1.3 x 1.0 cm ground-glass
attenuation nodule. Mild linear scarring noted in the lung bases
bilaterally. No acute consolidative airspace disease. No pleural
effusions.

Upper Abdomen: Aortic atherosclerosis.

Musculoskeletal: Status post left shoulder arthroplasty. Multiple
old healed posterior left-sided rib fractures. There are no
aggressive appearing lytic or blastic lesions noted in the
visualized portions of the skeleton.
IMPRESSION: 1. Similar appearance of pulmonary nodules in the lungs bilaterally,
as detailed above, when compared to recent prior study from
04/22/2018. The dominant lesion measures 1.5 x 1.7 cm in the left
upper lobe.
2. Aortic atherosclerosis, in addition to left main and 3 vessel
coronary artery disease.
3. There are calcifications of the aortic valve. Echocardiographic
correlation for evaluation of potential valvular dysfunction may be
warranted if clinically indicated.

Aortic Atherosclerosis ([id number]-781.1).

## 2019-11-07 IMAGING — CR PORTABLE CHEST - 1 VIEW
1 series · 1 of 1 positions shown · non-contrast
Comparison: Chest CT April 27, 2018 and chest radiograph January 16, 2016

CLINICAL DATA: Status post bronchoscopy

EXAM:
PORTABLE CHEST 1 VIEW

[AP]
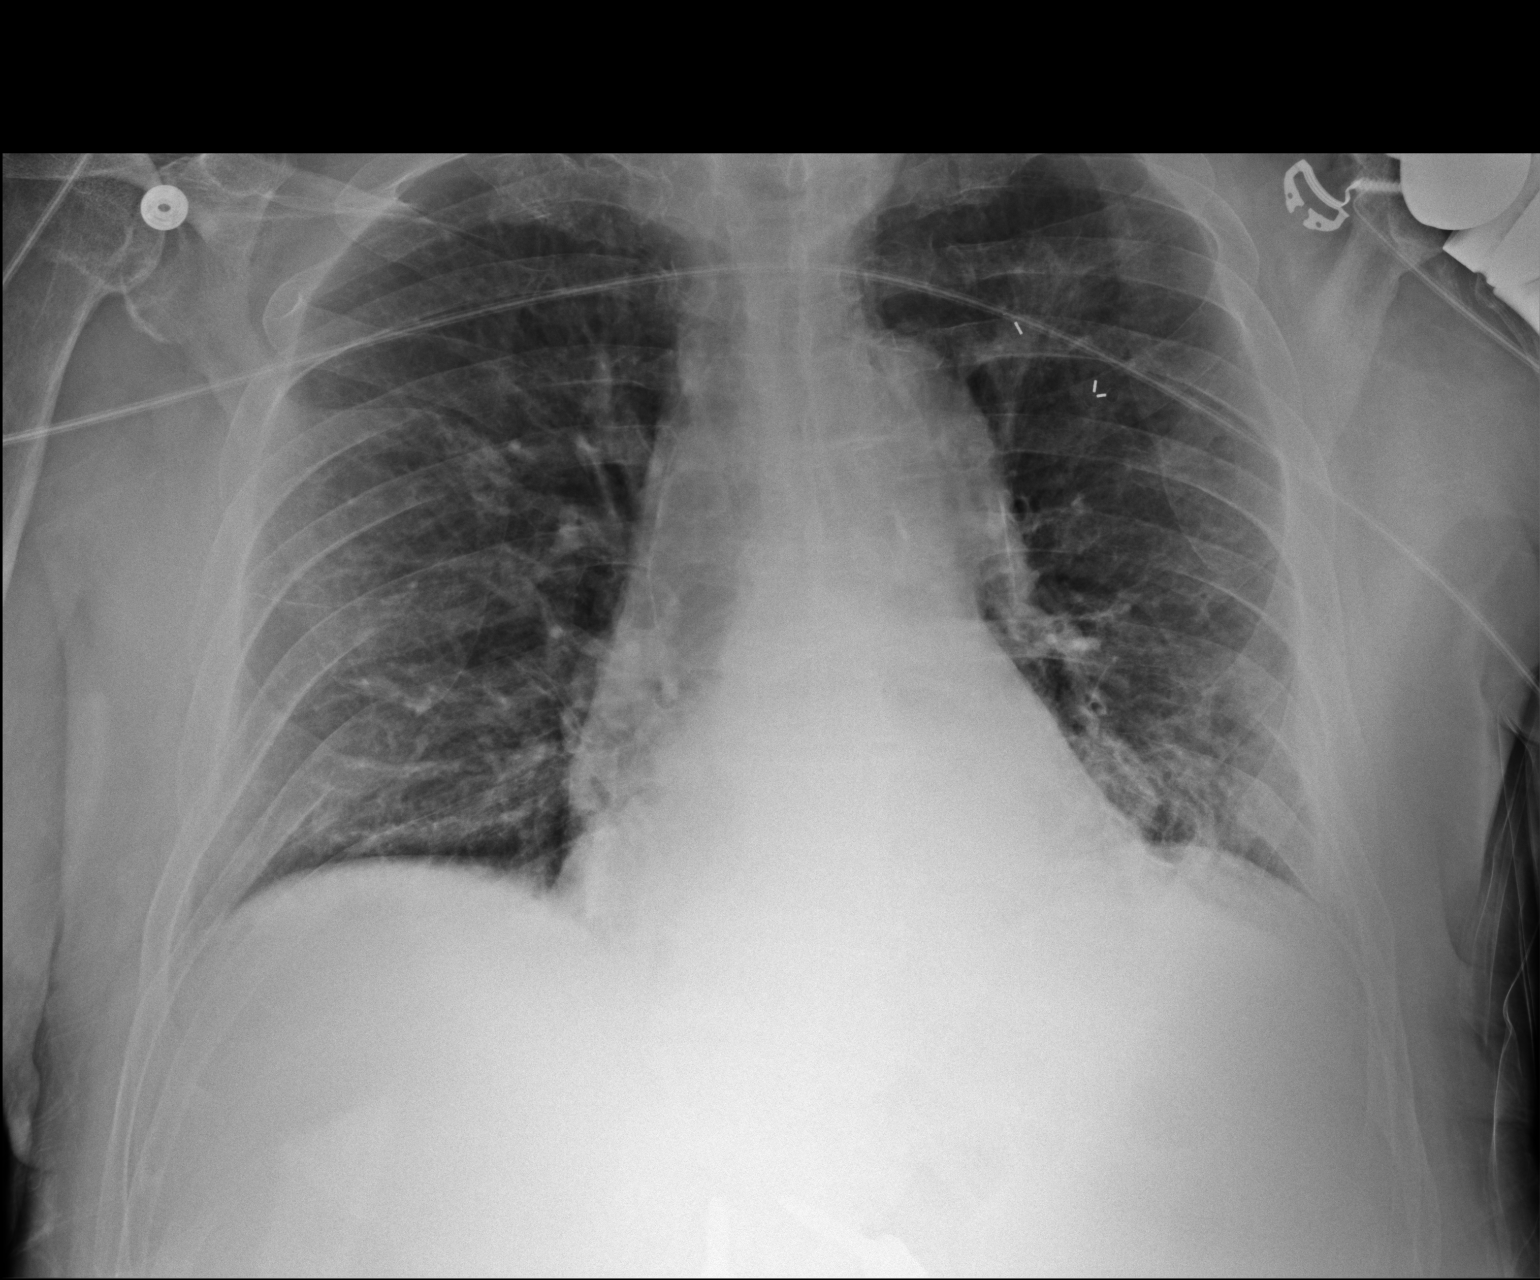

[1 of 1 positions shown; findings below may reference images not displayed]

FINDINGS: No appreciable pneumothorax. Fiducial markers are placed in the area
of irregular opacity in the left upper lobe. There is atelectatic
change in the left base. Right lung is clear except for slight
perihilar atelectasis on the right. Heart is upper normal in size
with pulmonary vascularity normal. No adenopathy. There is aortic
atherosclerosis. Patient is status post shoulder replacement on the
left.
IMPRESSION: No pneumothorax. Fiducial markers placed in left upper lobe at site
of somewhat irregular opacity. There is atelectatic change in the
left base and right perihilar regions. Lungs elsewhere clear. Heart
upper normal in size. No adenopathy evident. Aortic Atherosclerosis
([id number]).
